# Patient Record
Sex: Male | Born: 1975 | Race: Black or African American | Hispanic: No | Marital: Single | Smoking: Current every day smoker
Health system: Southern US, Community
[De-identification: ages and names within clinical notes are randomized; demographics above are authoritative.]

## PROBLEM LIST (undated history)

## (undated) DIAGNOSIS — F209 Schizophrenia, unspecified: Secondary | ICD-10-CM

## (undated) DIAGNOSIS — N182 Chronic kidney disease, stage 2 (mild): Secondary | ICD-10-CM

## (undated) DIAGNOSIS — D509 Iron deficiency anemia, unspecified: Secondary | ICD-10-CM

## (undated) DIAGNOSIS — Z720 Tobacco use: Secondary | ICD-10-CM

## (undated) HISTORY — PX: NEPHROSTOMY: SHX1014

## (undated) HISTORY — PX: OTHER SURGICAL HISTORY: SHX169

## (undated) HISTORY — PX: HERNIA REPAIR: SHX51

---

## 2001-05-11 ENCOUNTER — Emergency Department (HOSPITAL_COMMUNITY): Admission: EM | Admit: 2001-05-11 | Discharge: 2001-05-11 | Payer: Self-pay | Admitting: Emergency Medicine

## 2018-02-13 DIAGNOSIS — R319 Hematuria, unspecified: Secondary | ICD-10-CM | POA: Diagnosis not present

## 2018-02-13 DIAGNOSIS — N10 Acute tubulo-interstitial nephritis: Secondary | ICD-10-CM

## 2018-02-13 DIAGNOSIS — N1330 Unspecified hydronephrosis: Secondary | ICD-10-CM

## 2018-02-13 DIAGNOSIS — C679 Malignant neoplasm of bladder, unspecified: Secondary | ICD-10-CM

## 2018-02-14 DIAGNOSIS — R319 Hematuria, unspecified: Secondary | ICD-10-CM | POA: Diagnosis not present

## 2018-02-14 DIAGNOSIS — N1330 Unspecified hydronephrosis: Secondary | ICD-10-CM | POA: Diagnosis not present

## 2018-02-14 DIAGNOSIS — N10 Acute tubulo-interstitial nephritis: Secondary | ICD-10-CM | POA: Diagnosis not present

## 2018-02-14 DIAGNOSIS — C679 Malignant neoplasm of bladder, unspecified: Secondary | ICD-10-CM | POA: Diagnosis not present

## 2018-02-15 DIAGNOSIS — C679 Malignant neoplasm of bladder, unspecified: Secondary | ICD-10-CM | POA: Diagnosis not present

## 2018-02-15 DIAGNOSIS — N10 Acute tubulo-interstitial nephritis: Secondary | ICD-10-CM | POA: Diagnosis not present

## 2018-02-15 DIAGNOSIS — N1330 Unspecified hydronephrosis: Secondary | ICD-10-CM | POA: Diagnosis not present

## 2018-02-15 DIAGNOSIS — R319 Hematuria, unspecified: Secondary | ICD-10-CM | POA: Diagnosis not present

## 2018-02-16 DIAGNOSIS — N1330 Unspecified hydronephrosis: Secondary | ICD-10-CM | POA: Diagnosis not present

## 2018-02-16 DIAGNOSIS — C679 Malignant neoplasm of bladder, unspecified: Secondary | ICD-10-CM | POA: Diagnosis not present

## 2018-02-16 DIAGNOSIS — R319 Hematuria, unspecified: Secondary | ICD-10-CM | POA: Diagnosis not present

## 2018-02-16 DIAGNOSIS — N10 Acute tubulo-interstitial nephritis: Secondary | ICD-10-CM | POA: Diagnosis not present

## 2018-02-17 DIAGNOSIS — N10 Acute tubulo-interstitial nephritis: Secondary | ICD-10-CM | POA: Diagnosis not present

## 2018-02-17 DIAGNOSIS — C679 Malignant neoplasm of bladder, unspecified: Secondary | ICD-10-CM | POA: Diagnosis not present

## 2018-02-17 DIAGNOSIS — N1330 Unspecified hydronephrosis: Secondary | ICD-10-CM | POA: Diagnosis not present

## 2018-02-17 DIAGNOSIS — R319 Hematuria, unspecified: Secondary | ICD-10-CM | POA: Diagnosis not present

## 2018-02-18 DIAGNOSIS — N1330 Unspecified hydronephrosis: Secondary | ICD-10-CM | POA: Diagnosis not present

## 2018-02-18 DIAGNOSIS — C679 Malignant neoplasm of bladder, unspecified: Secondary | ICD-10-CM | POA: Diagnosis not present

## 2018-02-18 DIAGNOSIS — R319 Hematuria, unspecified: Secondary | ICD-10-CM | POA: Diagnosis not present

## 2018-02-18 DIAGNOSIS — N10 Acute tubulo-interstitial nephritis: Secondary | ICD-10-CM | POA: Diagnosis not present

## 2018-05-03 DIAGNOSIS — R590 Localized enlarged lymph nodes: Secondary | ICD-10-CM | POA: Diagnosis not present

## 2018-05-03 DIAGNOSIS — N1330 Unspecified hydronephrosis: Secondary | ICD-10-CM | POA: Diagnosis not present

## 2018-05-03 DIAGNOSIS — C679 Malignant neoplasm of bladder, unspecified: Secondary | ICD-10-CM | POA: Diagnosis not present

## 2018-05-03 DIAGNOSIS — Z809 Family history of malignant neoplasm, unspecified: Secondary | ICD-10-CM | POA: Diagnosis not present

## 2018-05-06 DIAGNOSIS — C772 Secondary and unspecified malignant neoplasm of intra-abdominal lymph nodes: Secondary | ICD-10-CM

## 2018-05-06 DIAGNOSIS — D508 Other iron deficiency anemias: Secondary | ICD-10-CM | POA: Diagnosis not present

## 2018-05-06 DIAGNOSIS — R319 Hematuria, unspecified: Secondary | ICD-10-CM | POA: Diagnosis not present

## 2018-05-06 DIAGNOSIS — C679 Malignant neoplasm of bladder, unspecified: Secondary | ICD-10-CM

## 2018-05-24 DIAGNOSIS — C679 Malignant neoplasm of bladder, unspecified: Secondary | ICD-10-CM | POA: Diagnosis not present

## 2018-05-24 DIAGNOSIS — C772 Secondary and unspecified malignant neoplasm of intra-abdominal lymph nodes: Secondary | ICD-10-CM

## 2018-05-31 DIAGNOSIS — C672 Malignant neoplasm of lateral wall of bladder: Secondary | ICD-10-CM | POA: Diagnosis not present

## 2018-05-31 DIAGNOSIS — I517 Cardiomegaly: Secondary | ICD-10-CM | POA: Diagnosis not present

## 2018-06-16 DIAGNOSIS — C772 Secondary and unspecified malignant neoplasm of intra-abdominal lymph nodes: Secondary | ICD-10-CM | POA: Diagnosis not present

## 2018-06-16 DIAGNOSIS — C679 Malignant neoplasm of bladder, unspecified: Secondary | ICD-10-CM

## 2018-07-07 DIAGNOSIS — F209 Schizophrenia, unspecified: Secondary | ICD-10-CM

## 2018-07-07 DIAGNOSIS — C679 Malignant neoplasm of bladder, unspecified: Secondary | ICD-10-CM | POA: Diagnosis not present

## 2018-07-07 DIAGNOSIS — C772 Secondary and unspecified malignant neoplasm of intra-abdominal lymph nodes: Secondary | ICD-10-CM

## 2018-08-18 DIAGNOSIS — F209 Schizophrenia, unspecified: Secondary | ICD-10-CM

## 2018-08-18 DIAGNOSIS — C772 Secondary and unspecified malignant neoplasm of intra-abdominal lymph nodes: Secondary | ICD-10-CM

## 2018-08-18 DIAGNOSIS — C679 Malignant neoplasm of bladder, unspecified: Secondary | ICD-10-CM

## 2018-10-26 DIAGNOSIS — N390 Urinary tract infection, site not specified: Secondary | ICD-10-CM | POA: Diagnosis not present

## 2018-10-26 DIAGNOSIS — D500 Iron deficiency anemia secondary to blood loss (chronic): Secondary | ICD-10-CM | POA: Diagnosis not present

## 2018-10-26 DIAGNOSIS — R109 Unspecified abdominal pain: Secondary | ICD-10-CM

## 2018-10-26 DIAGNOSIS — C678 Malignant neoplasm of overlapping sites of bladder: Secondary | ICD-10-CM

## 2018-10-26 DIAGNOSIS — F209 Schizophrenia, unspecified: Secondary | ICD-10-CM | POA: Diagnosis not present

## 2018-10-26 DIAGNOSIS — R319 Hematuria, unspecified: Secondary | ICD-10-CM

## 2018-10-27 DIAGNOSIS — F209 Schizophrenia, unspecified: Secondary | ICD-10-CM | POA: Diagnosis not present

## 2018-10-27 DIAGNOSIS — D500 Iron deficiency anemia secondary to blood loss (chronic): Secondary | ICD-10-CM | POA: Diagnosis not present

## 2018-10-27 DIAGNOSIS — N390 Urinary tract infection, site not specified: Secondary | ICD-10-CM | POA: Diagnosis not present

## 2018-10-27 DIAGNOSIS — R109 Unspecified abdominal pain: Secondary | ICD-10-CM | POA: Diagnosis not present

## 2018-10-28 DIAGNOSIS — R109 Unspecified abdominal pain: Secondary | ICD-10-CM | POA: Diagnosis not present

## 2018-10-28 DIAGNOSIS — D500 Iron deficiency anemia secondary to blood loss (chronic): Secondary | ICD-10-CM | POA: Diagnosis not present

## 2018-10-28 DIAGNOSIS — N390 Urinary tract infection, site not specified: Secondary | ICD-10-CM | POA: Diagnosis not present

## 2018-10-28 DIAGNOSIS — F209 Schizophrenia, unspecified: Secondary | ICD-10-CM | POA: Diagnosis not present

## 2018-10-29 DIAGNOSIS — R109 Unspecified abdominal pain: Secondary | ICD-10-CM | POA: Diagnosis not present

## 2018-10-29 DIAGNOSIS — F209 Schizophrenia, unspecified: Secondary | ICD-10-CM | POA: Diagnosis not present

## 2018-10-29 DIAGNOSIS — N390 Urinary tract infection, site not specified: Secondary | ICD-10-CM | POA: Diagnosis not present

## 2018-10-29 DIAGNOSIS — D500 Iron deficiency anemia secondary to blood loss (chronic): Secondary | ICD-10-CM | POA: Diagnosis not present

## 2018-10-30 DIAGNOSIS — R109 Unspecified abdominal pain: Secondary | ICD-10-CM | POA: Diagnosis not present

## 2018-10-30 DIAGNOSIS — D649 Anemia, unspecified: Secondary | ICD-10-CM

## 2018-10-30 DIAGNOSIS — F209 Schizophrenia, unspecified: Secondary | ICD-10-CM | POA: Diagnosis not present

## 2018-10-30 DIAGNOSIS — N390 Urinary tract infection, site not specified: Secondary | ICD-10-CM | POA: Diagnosis not present

## 2018-10-30 DIAGNOSIS — D500 Iron deficiency anemia secondary to blood loss (chronic): Secondary | ICD-10-CM | POA: Diagnosis not present

## 2018-10-31 DIAGNOSIS — N390 Urinary tract infection, site not specified: Secondary | ICD-10-CM | POA: Diagnosis not present

## 2018-10-31 DIAGNOSIS — D500 Iron deficiency anemia secondary to blood loss (chronic): Secondary | ICD-10-CM | POA: Diagnosis not present

## 2018-10-31 DIAGNOSIS — F209 Schizophrenia, unspecified: Secondary | ICD-10-CM | POA: Diagnosis not present

## 2018-10-31 DIAGNOSIS — R109 Unspecified abdominal pain: Secondary | ICD-10-CM | POA: Diagnosis not present

## 2018-11-01 DIAGNOSIS — D649 Anemia, unspecified: Secondary | ICD-10-CM

## 2018-11-01 DIAGNOSIS — K921 Melena: Secondary | ICD-10-CM | POA: Diagnosis not present

## 2018-11-01 DIAGNOSIS — C772 Secondary and unspecified malignant neoplasm of intra-abdominal lymph nodes: Secondary | ICD-10-CM | POA: Diagnosis not present

## 2018-11-01 DIAGNOSIS — C678 Malignant neoplasm of overlapping sites of bladder: Secondary | ICD-10-CM | POA: Diagnosis not present

## 2018-11-01 DIAGNOSIS — F4310 Post-traumatic stress disorder, unspecified: Secondary | ICD-10-CM

## 2018-11-01 DIAGNOSIS — F209 Schizophrenia, unspecified: Secondary | ICD-10-CM

## 2018-11-01 DIAGNOSIS — C679 Malignant neoplasm of bladder, unspecified: Secondary | ICD-10-CM | POA: Diagnosis not present

## 2018-11-02 DIAGNOSIS — C678 Malignant neoplasm of overlapping sites of bladder: Secondary | ICD-10-CM | POA: Diagnosis not present

## 2018-11-02 DIAGNOSIS — F209 Schizophrenia, unspecified: Secondary | ICD-10-CM | POA: Diagnosis not present

## 2018-11-02 DIAGNOSIS — K921 Melena: Secondary | ICD-10-CM | POA: Diagnosis not present

## 2018-11-02 DIAGNOSIS — D649 Anemia, unspecified: Secondary | ICD-10-CM | POA: Diagnosis not present

## 2018-11-03 DIAGNOSIS — F209 Schizophrenia, unspecified: Secondary | ICD-10-CM | POA: Diagnosis not present

## 2018-11-03 DIAGNOSIS — D649 Anemia, unspecified: Secondary | ICD-10-CM | POA: Diagnosis not present

## 2018-11-03 DIAGNOSIS — C678 Malignant neoplasm of overlapping sites of bladder: Secondary | ICD-10-CM | POA: Diagnosis not present

## 2018-11-03 DIAGNOSIS — K921 Melena: Secondary | ICD-10-CM | POA: Diagnosis not present

## 2018-11-04 ENCOUNTER — Inpatient Hospital Stay (HOSPITAL_COMMUNITY): Payer: Medicare Other

## 2018-11-04 ENCOUNTER — Encounter (HOSPITAL_COMMUNITY)
Admission: AD | Disposition: A | Discharge status: Home / Self Care | Payer: Self-pay | Source: Other Acute Inpatient Hospital | Attending: Internal Medicine

## 2018-11-04 ENCOUNTER — Encounter (HOSPITAL_COMMUNITY): Payer: Self-pay

## 2018-11-04 ENCOUNTER — Inpatient Hospital Stay (HOSPITAL_COMMUNITY): Payer: Medicare Other | Admitting: Anesthesiology

## 2018-11-04 ENCOUNTER — Inpatient Hospital Stay (HOSPITAL_COMMUNITY)
Admission: AD | Admit: 2018-11-04 | Discharge: 2018-11-07 | DRG: 871 | Disposition: A | Discharge status: Home / Self Care | Payer: Medicare Other | Source: Other Acute Inpatient Hospital | Attending: Internal Medicine | Admitting: Internal Medicine

## 2018-11-04 ENCOUNTER — Other Ambulatory Visit: Payer: Self-pay

## 2018-11-04 DIAGNOSIS — Z8551 Personal history of malignant neoplasm of bladder: Secondary | ICD-10-CM

## 2018-11-04 DIAGNOSIS — F17200 Nicotine dependence, unspecified, uncomplicated: Secondary | ICD-10-CM | POA: Diagnosis present

## 2018-11-04 DIAGNOSIS — E43 Unspecified severe protein-calorie malnutrition: Secondary | ICD-10-CM | POA: Diagnosis present

## 2018-11-04 DIAGNOSIS — K644 Residual hemorrhoidal skin tags: Secondary | ICD-10-CM | POA: Diagnosis present

## 2018-11-04 DIAGNOSIS — C679 Malignant neoplasm of bladder, unspecified: Secondary | ICD-10-CM | POA: Diagnosis present

## 2018-11-04 DIAGNOSIS — D509 Iron deficiency anemia, unspecified: Secondary | ICD-10-CM | POA: Diagnosis not present

## 2018-11-04 DIAGNOSIS — N182 Chronic kidney disease, stage 2 (mild): Secondary | ICD-10-CM | POA: Diagnosis present

## 2018-11-04 DIAGNOSIS — D500 Iron deficiency anemia secondary to blood loss (chronic): Secondary | ICD-10-CM | POA: Diagnosis present

## 2018-11-04 DIAGNOSIS — C7919 Secondary malignant neoplasm of other urinary organs: Secondary | ICD-10-CM | POA: Diagnosis present

## 2018-11-04 DIAGNOSIS — Z720 Tobacco use: Secondary | ICD-10-CM | POA: Diagnosis not present

## 2018-11-04 DIAGNOSIS — D638 Anemia in other chronic diseases classified elsewhere: Secondary | ICD-10-CM | POA: Diagnosis present

## 2018-11-04 DIAGNOSIS — Z884 Allergy status to anesthetic agent status: Secondary | ICD-10-CM

## 2018-11-04 DIAGNOSIS — N136 Pyonephrosis: Secondary | ICD-10-CM | POA: Diagnosis present

## 2018-11-04 DIAGNOSIS — R52 Pain, unspecified: Secondary | ICD-10-CM

## 2018-11-04 DIAGNOSIS — Z7189 Other specified counseling: Secondary | ICD-10-CM

## 2018-11-04 DIAGNOSIS — K922 Gastrointestinal hemorrhage, unspecified: Secondary | ICD-10-CM

## 2018-11-04 DIAGNOSIS — N390 Urinary tract infection, site not specified: Secondary | ICD-10-CM | POA: Diagnosis present

## 2018-11-04 DIAGNOSIS — Z936 Other artificial openings of urinary tract status: Secondary | ICD-10-CM | POA: Diagnosis not present

## 2018-11-04 DIAGNOSIS — K921 Melena: Secondary | ICD-10-CM | POA: Diagnosis present

## 2018-11-04 DIAGNOSIS — R509 Fever, unspecified: Secondary | ICD-10-CM | POA: Diagnosis not present

## 2018-11-04 DIAGNOSIS — A419 Sepsis, unspecified organism: Secondary | ICD-10-CM | POA: Diagnosis present

## 2018-11-04 DIAGNOSIS — K648 Other hemorrhoids: Secondary | ICD-10-CM | POA: Diagnosis present

## 2018-11-04 DIAGNOSIS — M545 Low back pain: Secondary | ICD-10-CM | POA: Diagnosis not present

## 2018-11-04 DIAGNOSIS — Z515 Encounter for palliative care: Secondary | ICD-10-CM | POA: Diagnosis present

## 2018-11-04 DIAGNOSIS — Z681 Body mass index (BMI) 19 or less, adult: Secondary | ICD-10-CM

## 2018-11-04 DIAGNOSIS — K56699 Other intestinal obstruction unspecified as to partial versus complete obstruction: Secondary | ICD-10-CM | POA: Diagnosis present

## 2018-11-04 DIAGNOSIS — F209 Schizophrenia, unspecified: Secondary | ICD-10-CM | POA: Diagnosis present

## 2018-11-04 DIAGNOSIS — D72829 Elevated white blood cell count, unspecified: Secondary | ICD-10-CM

## 2018-11-04 HISTORY — DX: Schizophrenia, unspecified: F20.9

## 2018-11-04 HISTORY — DX: Tobacco use: Z72.0

## 2018-11-04 HISTORY — PX: COLONOSCOPY: SHX5424

## 2018-11-04 HISTORY — DX: Chronic kidney disease, stage 2 (mild): N18.2

## 2018-11-04 HISTORY — DX: Iron deficiency anemia, unspecified: D50.9

## 2018-11-04 LAB — CBC
HCT: 28 % — ABNORMAL LOW (ref 39.0–52.0)
HCT: 28 % — ABNORMAL LOW (ref 39.0–52.0)
HCT: 28.7 % — ABNORMAL LOW (ref 39.0–52.0)
HCT: 25.3 % — ABNORMAL LOW (ref 39.0–52.0)
Hemoglobin: 8.4 g/dL — ABNORMAL LOW (ref 13.0–17.0)
Hemoglobin: 8.4 g/dL — ABNORMAL LOW (ref 13.0–17.0)
Hemoglobin: 8.4 g/dL — ABNORMAL LOW (ref 13.0–17.0)
Hemoglobin: 7.6 g/dL — ABNORMAL LOW (ref 13.0–17.0)
MCH: 25.8 pg — ABNORMAL LOW (ref 26.0–34.0)
MCH: 25.7 pg — ABNORMAL LOW (ref 26.0–34.0)
MCH: 25.3 pg — ABNORMAL LOW (ref 26.0–34.0)
MCH: 25.6 pg — ABNORMAL LOW (ref 26.0–34.0)
MCHC: 30 g/dL (ref 30.0–36.0)
MCHC: 30 g/dL (ref 30.0–36.0)
MCHC: 29.3 g/dL — ABNORMAL LOW (ref 30.0–36.0)
MCHC: 30 g/dL (ref 30.0–36.0)
MCV: 85.9 fL (ref 80.0–100.0)
MCV: 85.6 fL (ref 80.0–100.0)
MCV: 86.4 fL (ref 80.0–100.0)
MCV: 85.2 fL (ref 80.0–100.0)
PLATELETS: 393 10*3/uL (ref 150–400)
Platelets: 404 10*3/uL — ABNORMAL HIGH (ref 150–400)
Platelets: 411 10*3/uL — ABNORMAL HIGH (ref 150–400)
Platelets: 420 10*3/uL — ABNORMAL HIGH (ref 150–400)
RBC: 3.26 MIL/uL — ABNORMAL LOW (ref 4.22–5.81)
RBC: 3.27 MIL/uL — ABNORMAL LOW (ref 4.22–5.81)
RBC: 3.32 MIL/uL — ABNORMAL LOW (ref 4.22–5.81)
RBC: 2.97 MIL/uL — ABNORMAL LOW (ref 4.22–5.81)
RDW: 18.9 % — ABNORMAL HIGH (ref 11.5–15.5)
RDW: 18.8 % — ABNORMAL HIGH (ref 11.5–15.5)
RDW: 18.9 % — ABNORMAL HIGH (ref 11.5–15.5)
RDW: 18.7 % — ABNORMAL HIGH (ref 11.5–15.5)
WBC: 31.4 10*3/uL — ABNORMAL HIGH (ref 4.0–10.5)
WBC: 32.5 10*3/uL — ABNORMAL HIGH (ref 4.0–10.5)
WBC: 35.9 10*3/uL — ABNORMAL HIGH (ref 4.0–10.5)
WBC: 34.4 10*3/uL — AB (ref 4.0–10.5)
nRBC: 0 % (ref 0.0–0.2)
nRBC: 0 % (ref 0.0–0.2)
nRBC: 0 % (ref 0.0–0.2)
nRBC: 0 % (ref 0.0–0.2)

## 2018-11-04 LAB — DIFFERENTIAL
Abs Immature Granulocytes: 0.36 10*3/uL — ABNORMAL HIGH (ref 0.00–0.07)
Basophils Absolute: 0.1 10*3/uL (ref 0.0–0.1)
Basophils Relative: 0 %
Eosinophils Absolute: 0.2 10*3/uL (ref 0.0–0.5)
Eosinophils Relative: 1 %
Immature Granulocytes: 1 %
LYMPHS ABS: 2.7 10*3/uL (ref 0.7–4.0)
Lymphocytes Relative: 9 %
MONOS PCT: 6 %
Monocytes Absolute: 1.8 10*3/uL — ABNORMAL HIGH (ref 0.1–1.0)
Neutro Abs: 26.4 10*3/uL — ABNORMAL HIGH (ref 1.7–7.7)
Neutrophils Relative %: 83 %

## 2018-11-04 LAB — BASIC METABOLIC PANEL
Anion gap: 8 (ref 5–15)
BUN: 10 mg/dL (ref 6–20)
CO2: 23 mmol/L (ref 22–32)
Calcium: 11.2 mg/dL — ABNORMAL HIGH (ref 8.9–10.3)
Chloride: 105 mmol/L (ref 98–111)
Creatinine, Ser: 1.13 mg/dL (ref 0.61–1.24)
GFR calc Af Amer: >60 mL/min (ref >60)
GFR calc non Af Amer: >60 mL/min (ref >60)
GLUCOSE: 116 mg/dL — AB (ref 70–99)
Potassium: 3.9 mmol/L (ref 3.5–5.1)
Sodium: 136 mmol/L (ref 135–145)

## 2018-11-04 LAB — URINALYSIS, ROUTINE W REFLEX MICROSCOPIC
Bilirubin Urine: NEGATIVE
Glucose, UA: NEGATIVE mg/dL
Ketones, ur: NEGATIVE mg/dL
Nitrite: NEGATIVE
Protein, ur: 100 mg/dL — AB
RBC / HPF: >50 RBC/hpf — ABNORMAL HIGH (ref 0–5)
Specific Gravity, Urine: 1.026 (ref 1.005–1.030)
WBC, UA: >50 WBC/hpf — ABNORMAL HIGH (ref 0–5)
pH: 7 (ref 5.0–8.0)

## 2018-11-04 LAB — PROTIME-INR
INR: 1.2
INR: 1.24
Prothrombin Time: 15.1 seconds (ref 11.4–15.2)
Prothrombin Time: 15.4 seconds — ABNORMAL HIGH (ref 11.4–15.2)

## 2018-11-04 LAB — APTT
aPTT: 31 seconds (ref 24–36)
aPTT: 42 seconds — ABNORMAL HIGH (ref 24–36)

## 2018-11-04 LAB — LACTIC ACID, PLASMA
Lactic Acid, Venous: 0.7 mmol/L (ref 0.5–1.9)
Lactic Acid, Venous: 1.5 mmol/L (ref 0.5–1.9)

## 2018-11-04 LAB — ABO/RH: ABO/RH(D): B POS

## 2018-11-04 LAB — PROCALCITONIN: Procalcitonin: 4.44 ng/mL

## 2018-11-04 LAB — HIV ANTIBODY (ROUTINE TESTING W REFLEX): HIV Screen 4th Generation wRfx: NONREACTIVE

## 2018-11-04 SURGERY — COLONOSCOPY
Anesthesia: Monitor Anesthesia Care

## 2018-11-04 MED ORDER — SODIUM CHLORIDE 0.9 % IV BOLUS
500.0000 mL | Freq: Once | INTRAVENOUS | Status: AC
  Administered 2018-11-04: 500 mL via INTRAVENOUS

## 2018-11-04 MED ORDER — CLOZAPINE 100 MG PO TABS
100.0000 mg | ORAL_TABLET | Freq: Every morning | ORAL | Status: DC
  Administered 2018-11-04 – 2018-11-05 (×2): 100 mg via ORAL
  Filled 2018-11-04 (×3): qty 1

## 2018-11-04 MED ORDER — UNJURY CHICKEN SOUP POWDER
2.0000 [oz_av] | Freq: Four times a day (QID) | ORAL | Status: DC
  Administered 2018-11-05 – 2018-11-06 (×2): 2 [oz_av] via ORAL
  Filled 2018-11-04 (×15): qty 27

## 2018-11-04 MED ORDER — NICOTINE 21 MG/24HR TD PT24
21.0000 mg | MEDICATED_PATCH | Freq: Every day | TRANSDERMAL | Status: DC
  Administered 2018-11-04 – 2018-11-07 (×3): 21 mg via TRANSDERMAL
  Filled 2018-11-04 (×4): qty 1

## 2018-11-04 MED ORDER — PROSIGHT PO TABS
1.0000 | ORAL_TABLET | Freq: Every day | ORAL | Status: DC
  Administered 2018-11-04 – 2018-11-07 (×4): 1 via ORAL
  Filled 2018-11-04 (×4): qty 1

## 2018-11-04 MED ORDER — SODIUM CHLORIDE 0.9 % IV SOLN: INTRAVENOUS | Status: DC

## 2018-11-04 MED ORDER — ONDANSETRON HCL 4 MG PO TABS: 4.0000 mg | ORAL_TABLET | Freq: Four times a day (QID) | ORAL | Status: DC | PRN

## 2018-11-04 MED ORDER — PIPERACILLIN-TAZOBACTAM 3.375 G IVPB
3.3750 g | Freq: Three times a day (TID) | INTRAVENOUS | Status: DC
  Administered 2018-11-04 – 2018-11-07 (×10): 3.375 g via INTRAVENOUS
  Filled 2018-11-04 (×12): qty 50

## 2018-11-04 MED ORDER — SENNOSIDES-DOCUSATE SODIUM 8.6-50 MG PO TABS: 1.0000 | ORAL_TABLET | Freq: Every day | ORAL | Status: DC | PRN

## 2018-11-04 MED ORDER — ZOLPIDEM TARTRATE 5 MG PO TABS: 5.0000 mg | ORAL_TABLET | Freq: Every evening | ORAL | Status: DC | PRN

## 2018-11-04 MED ORDER — HYDRALAZINE HCL 20 MG/ML IJ SOLN: 5.0000 mg | INTRAMUSCULAR | Status: DC | PRN

## 2018-11-04 MED ORDER — PROPOFOL 10 MG/ML IV BOLUS
INTRAVENOUS | Status: AC
  Filled 2018-11-04: qty 40

## 2018-11-04 MED ORDER — ENSURE ENLIVE PO LIQD
237.0000 mL | Freq: Three times a day (TID) | ORAL | Status: DC
  Administered 2018-11-04 – 2018-11-06 (×5): 237 mL via ORAL

## 2018-11-04 MED ORDER — ACETAMINOPHEN 325 MG PO TABS
650.0000 mg | ORAL_TABLET | Freq: Four times a day (QID) | ORAL | Status: DC | PRN
  Administered 2018-11-04 – 2018-11-06 (×2): 650 mg via ORAL
  Filled 2018-11-04 (×2): qty 2

## 2018-11-04 MED ORDER — PANTOPRAZOLE SODIUM 40 MG PO TBEC
40.0000 mg | DELAYED_RELEASE_TABLET | Freq: Two times a day (BID) | ORAL | Status: DC
  Administered 2018-11-04 – 2018-11-07 (×8): 40 mg via ORAL
  Filled 2018-11-04 (×8): qty 1

## 2018-11-04 MED ORDER — PROPOFOL 500 MG/50ML IV EMUL
INTRAVENOUS | Status: DC | PRN
  Administered 2018-11-04: 150 ug/kg/min via INTRAVENOUS

## 2018-11-04 MED ORDER — PROPOFOL 10 MG/ML IV BOLUS
INTRAVENOUS | Status: DC | PRN
  Administered 2018-11-04: 30 mg via INTRAVENOUS

## 2018-11-04 MED ORDER — CLOZAPINE 100 MG PO TABS
200.0000 mg | ORAL_TABLET | Freq: Every day | ORAL | Status: DC
  Administered 2018-11-04 – 2018-11-05 (×2): 200 mg via ORAL
  Filled 2018-11-04 (×3): qty 2

## 2018-11-04 MED ORDER — MORPHINE SULFATE (PF) 2 MG/ML IV SOLN
2.0000 mg | INTRAVENOUS | Status: DC | PRN
  Administered 2018-11-04 – 2018-11-07 (×4): 2 mg via INTRAVENOUS
  Filled 2018-11-04 (×4): qty 1

## 2018-11-04 MED ORDER — SODIUM CHLORIDE 0.9 % IV BOLUS
1000.0000 mL | Freq: Once | INTRAVENOUS | Status: AC
  Administered 2018-11-04: 1000 mL via INTRAVENOUS

## 2018-11-04 MED ORDER — OCUVITE-LUTEIN PO CAPS: 1.0000 | ORAL_CAPSULE | Freq: Every day | ORAL | Status: DC

## 2018-11-04 MED ORDER — FERROUS SULFATE 325 (65 FE) MG PO TABS
325.0000 mg | ORAL_TABLET | Freq: Two times a day (BID) | ORAL | Status: DC
  Administered 2018-11-04 – 2018-11-07 (×6): 325 mg via ORAL
  Filled 2018-11-04 (×6): qty 1

## 2018-11-04 MED ORDER — ACETAMINOPHEN 650 MG RE SUPP: 650.0000 mg | Freq: Four times a day (QID) | RECTAL | Status: DC | PRN

## 2018-11-04 MED ORDER — ONDANSETRON HCL 4 MG/2ML IJ SOLN: 4.0000 mg | Freq: Four times a day (QID) | INTRAMUSCULAR | Status: DC | PRN

## 2018-11-04 MED ORDER — OXYCODONE HCL 5 MG PO TABS
5.0000 mg | ORAL_TABLET | Freq: Four times a day (QID) | ORAL | Status: DC | PRN
  Administered 2018-11-05 – 2018-11-07 (×5): 5 mg via ORAL
  Filled 2018-11-04 (×5): qty 1

## 2018-11-04 MED ORDER — SODIUM CHLORIDE 0.9 % IV SOLN
INTRAVENOUS | Status: DC
  Administered 2018-11-05 – 2018-11-06 (×5): via INTRAVENOUS

## 2018-11-04 MED ORDER — VANCOMYCIN HCL 10 G IV SOLR
1250.0000 mg | INTRAVENOUS | Status: DC
  Administered 2018-11-04 – 2018-11-05 (×2): 1250 mg via INTRAVENOUS
  Filled 2018-11-04 (×2): qty 1250

## 2018-11-04 NOTE — Transfer of Care (Signed)
Immediate Anesthesia Transfer of Care Note  Patient: Nathan Knox  Procedure(s) Performed: COLONOSCOPY (N/A )  Patient Location: PACU and Endoscopy Unit  Anesthesia Type:MAC  Level of Consciousness: awake, drowsy and responds to stimulation  Airway & Oxygen Therapy: Patient Spontanous Breathing and Patient connected to face mask oxygen  Post-op Assessment: Report given to RN and Post -op Vital signs reviewed and stable  Post vital signs: Reviewed and stable  Last Vitals:  Vitals Value Taken Time  BP    Temp    Pulse    Resp    SpO2      Last Pain:  Vitals:   11/04/18 1142  TempSrc: Oral  PainSc: 5          Complications: No apparent anesthesia complications

## 2018-11-04 NOTE — Anesthesia Postprocedure Evaluation (Signed)
Anesthesia Post Note  Patient: Nathan Knox  Procedure(s) Performed: COLONOSCOPY (N/A )     Patient location during evaluation: Endoscopy Anesthesia Type: MAC Level of consciousness: awake and alert Pain management: pain level controlled Vital Signs Assessment: post-procedure vital signs reviewed and stable Respiratory status: spontaneous breathing, nonlabored ventilation, respiratory function stable and patient connected to nasal cannula oxygen Cardiovascular status: blood pressure returned to baseline and stable Postop Assessment: no apparent nausea or vomiting Anesthetic complications: no    Last Vitals:  Vitals:   11/04/18 1220 11/04/18 1230  BP: (!) 90/42 (!) 101/48  Pulse: 95 97  Resp: (!) 27 17  Temp:    SpO2: 100% 100%    Last Pain:  Vitals:   11/04/18 1142  TempSrc: Oral  PainSc: 5                  Sophiana Milanese DANIEL

## 2018-11-04 NOTE — Progress Notes (Signed)
Pharmacy Antibiotic Note  Osamu Olguin is a 42 y.o. male admitted on 11/04/2018 with UTI and Sepsis  Pharmacy has been consulted for zosyn dosing.  Plan: Zosyn 3.375g IV q8h (4 hour infusion).  F/u scr/cultures     Temp (24hrs), Avg:98.7 F (37.1 C), Min:98.7 F (37.1 C), Max:98.7 F (37.1 C)  No results for input(s): WBC, CREATININE, LATICACIDVEN, VANCOTROUGH, VANCOPEAK, VANCORANDOM, GENTTROUGH, GENTPEAK, GENTRANDOM, TOBRATROUGH, TOBRAPEAK, TOBRARND, AMIKACINPEAK, AMIKACINTROU, AMIKACIN in the last 168 hours.  CrCl cannot be calculated (No successful lab value found.).    Allergies  Allergen Reactions  . Anesthesia S-I-60     Hyperthermia, coma-like statw    Antimicrobials this admission: 12/13 zosyn >>    >>   Dose adjustments this admission:   Microbiology results:  BCx:   UCx:    Sputum:    MRSA PCR:  Thank you for allowing pharmacy to be a part of this patient's care.  Dorrene German 11/04/2018 2:45 AM

## 2018-11-04 NOTE — Consult Note (Signed)
Reason for Consult: Bright red blood per rectum abnormal CT scan Referring Physician: Hospitalist team  Nathan Knox is an 42 y.o. male.  HPI: Patient seen and examined in hospital computer chart reviewed and case discussed with hospitalist at outlying hospital as well as Dr. Grandville Silos today and on CT it sounds like his tumor may be eroding into his sigmoid and has had bright red blood mixed in his stools for a while and has required multiple transfusions and has not had a flex sig or colonoscopy before and he has no other complaints  Past Medical History:  Diagnosis Date  . CKD (chronic kidney disease), stage II   . Microcytic anemia   . Schizophrenia ([place])   . Tobacco abuse     Past Surgical History:  Procedure Laterality Date  . Bladder cancer removal    . HERNIA REPAIR    . NEPHROSTOMY Bilateral     Family History  Problem Relation Age of Onset  . COPD Mother   . Hypertension Father     Social History:  reports that he has been smoking. He has never used smokeless tobacco. He reports that he does not drink alcohol or use drugs.  Allergies:  Allergies  Allergen Reactions  . Anesthesia S-I-60     Hyperthermia, coma-like statw    Medications: I have reviewed the patient's current medications.  Results for orders placed or performed during the hospital encounter of 11/04/18 (from the past 48 hour(s))  Type and screen [place]     Status: None   Collection Time: 11/04/18  2:49 AM  Result Value Ref Range   ABO/RH(D) B POS    Antibody Screen NEG    Sample Expiration      11/07/2018 Performed at [facility], [place] [address], [place], [place] [address]   ABO/Rh     Status: None   Collection Time: 11/04/18  3:03 AM  Result Value Ref Range   ABO/RH(D)      B POS Performed at [facility], [place] [address] [place], [place] [address]   CBC     Status: Abnormal   Collection Time: 11/04/18  3:06 AM  Result Value  Ref Range   WBC 31.4 (H) 4.0 - 10.5 K/uL    Comment: WHITE COUNT CONFIRMED ON SMEAR   RBC 3.26 (L) 4.22 - 5.81 MIL/uL   Hemoglobin 8.4 (L) 13.0 - 17.0 g/dL   HCT 28.0 (L) 39.0 - 52.0 %   MCV 85.9 80.0 - 100.0 fL   MCH 25.8 (L) 26.0 - 34.0 pg   MCHC 30.0 30.0 - 36.0 g/dL   RDW 18.9 (H) 11.5 - 15.5 %   Platelets 404 (H) 150 - 400 K/uL   nRBC 0.0 0.0 - 0.2 %    Comment: Performed at [facility], [place] [address] [place], [place] [address]  Protime-INR     Status: None   Collection Time: 11/04/18  3:06 AM  Result Value Ref Range   Prothrombin Time 15.1 11.4 - 15.2 seconds   INR 1.20     Comment: Performed at [facility], [place] [address] [place], [place] [address]  APTT     Status: None   Collection Time: 11/04/18  3:06 AM  Result Value Ref Range   aPTT 31 24 - 36 seconds    Comment: Performed at [facility], [place] [address], [place],  [address]  Differential     Status:  Abnormal   Collection Time: 11/04/18  3:06 AM  Result Value Ref Range   Neutrophils Relative % 83 %   Neutro Abs 26.4 (H) 1.7 - 7.7 K/uL   Lymphocytes Relative 9 %   Lymphs Abs 2.7 0.7 - 4.0 K/uL   Monocytes Relative 6 %   Monocytes Absolute 1.8 (H) 0.1 - 1.0 K/uL   Eosinophils Relative 1 %   Eosinophils Absolute 0.2 0.0 - 0.5 K/uL   Basophils Relative 0 %   Basophils Absolute 0.1 0.0 - 0.1 K/uL   Immature Granulocytes 1 %   Abs Immature Granulocytes 0.36 (H) 0.00 - 0.07 K/uL    Comment: Performed at [facility], [place] [address], [place], [place] [address]  Basic metabolic panel     Status: Abnormal   Collection Time: 11/04/18  3:07 AM  Result Value Ref Range   Sodium 136 135 - 145 mmol/L   Potassium 3.9 3.5 - 5.1 mmol/L   Chloride 105 98 - 111 mmol/L   CO2 23 22 - 32 mmol/L   Glucose, Bld 116 (H) 70 - 99 mg/dL   BUN 10 6 - 20 mg/dL   Creatinine, Ser 1.13 0.61 - 1.24 mg/dL   Calcium 11.2 (H) 8.9 - 10.3 mg/dL   GFR calc  non Af Amer >60 >60 mL/min   GFR calc Af Amer >60 >60 mL/min   Anion gap 8 5 - 15    Comment: Performed at [facility], [place] [address], [place], [place] [address]  CBC     Status: Abnormal   Collection Time: 11/04/18  9:46 AM  Result Value Ref Range   WBC 32.5 (H) 4.0 - 10.5 K/uL   RBC 3.27 (L) 4.22 - 5.81 MIL/uL   Hemoglobin 8.4 (L) 13.0 - 17.0 g/dL   HCT 28.0 (L) 39.0 - 52.0 %   MCV 85.6 80.0 - 100.0 fL   MCH 25.7 (L) 26.0 - 34.0 pg   MCHC 30.0 30.0 - 36.0 g/dL   RDW 18.8 (H) 11.5 - 15.5 %   Platelets 411 (H) 150 - 400 K/uL   nRBC 0.0 0.0 - 0.2 %    Comment: Performed at [facility], [place] [address], [place], [place] [address]    No results found.  ROS negative except above Blood pressure (!) 101/51, pulse 96, temperature 98.1 F (36.7 C), temperature source Oral, resp. rate 18, height 6\' 2"  (1.88 m), weight 61.8 kg, SpO2 95 %. Physical Exam vital signs stable afebrile no acute distress exam please see preassessment evaluation labs reviewed  Assessment/Plan: Bright red blood per rectum questionable sigmoid involvement from bladder tumor Plan: We discussed a flex sig which we will proceed with with anesthesia assistance and he may need a full colonoscopy depending on the prep and the findings  Treyven Lafauci E 11/04/2018, 11:35 AM

## 2018-11-04 NOTE — Progress Notes (Signed)
I have seen and assessed patient and agree with Dr.Niu's assessment and plan.  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  Patient of note with history of bladder cancer, status post bladder cancer removal, status post bilateral nephrostomy tubes placed 6 weeks ago at [facility] who elected to stop treatment several months ago.  Patient noted on 09/23/2018 to have a hemoglobin of 6 from 7.7 after presentation rectal bleeding with bright red blood.  Patient transfused packed red blood cells hemoglobin back up to 8.2.  GI was consulted at outside hospital recommending colonoscopy however on day of procedure patient refused.  Patient later changed his mind however gastro enterology was no longer available at the outside hospital for the next 3 weeks and patient subsequently transferred to [facility] for further GI evaluation.  Patient noted to be transfused 2 units packed red blood cells on 10/26/2018 with hemoglobin back up to 8.2.  Patient also noted to have sepsis secondary to UTI with a leukocytosis of 30,000.  Fever noted to have resolved.  CT scan which was done did not show any evidence of abscess.  Patient placed empirically on IV Zosyn.  Repeat cultures have been ordered.  Patient currently n.p.o. last meal was 5:30 PM on 11/03/2018.  Patient denies any rectal bleeding this morning.  Patient likely to undergo flex sigmoidoscopy later on today.  GI consulted, spoke with Dr. Watt Climes.  Bowel prep to be ordered.  Check H&H this afternoon.  Transfusion threshold hemoglobin less than 7.  Follow.   No charge.

## 2018-11-04 NOTE — Progress Notes (Signed)
Patient had a fever of 101.6, and tachycardia 117.  Dr. Grandville Silos on floor notified.  Due to patient being in the red for the mews sepsis protocol, ICU rapid response nurse notified, and will look at patient.  Patient started on tele monitor, currently sinus tach.

## 2018-11-04 NOTE — H&P (Addendum)
History and Physical    Nathan Knox VHQ:[id number] DOB: [birth year] DOA: 11/04/2018  Referring MD/NP/PA:   PCP: Patient, No Pcp Per   Patient coming from:  The patient is coming from home.  At baseline, pt is independent for most of ADL.        Chief Complaint: rectal bleeding and back pain  HPI: Nathan Knox is a 42 y.o. male with medical history significant of advanced bladder urothelial cancer (elevated to stop treatment few months ago), s/p of bilateral nephrostomy tube placement, schizophrenia, CKD-2, who is transfused from [facility] to [facility] due to GIB.  Pt has hx of bladder cancer, status post bladder cancer removal, and s/p of bilateral nephrostomy tubes placed 6 weeks ago at [facility]. He elected to stop treatment for several months ago. Pt was initially admitted to [facility] due to GI bleeding and back pain on 10/26/2018.  Pt states that his right nephrostomy tube stop having any output several weeks ago. He developed severe, constant and sharp back pain. IR was consulted and exchanged his L nephrostomy catheter and placed a new R nephrostomy catheter. They did a left nephrostogram on 12/6 due to concern for lack of output, but it was noted to be in good position. Repeat CT abdomen pelvis shows new right-sided percutaneous nephrostomy, stable large urinary bladder carcinoma, stable left renal mass and lymphadenopathy.   He was found to have sepsis due to UTI. He's been initially treated with ceftriaxone for presumed UTI. Urine cx returned negative. Urology was c/s, but nothing to offer from surgical standpoint. He had recurrent fever on 12/8. He was recultured and abx broadened to zosyn. His fever has resolved. Today his body temperature is 98.7.  He had leukocytosis with WBC 24, which has been persistent. This morning WBC was 30, which was thought to be partially due to cancer.  Pt also has rectal bleeding with bright red blood.  His hemoglobin dropped from 7.7  on 09/23/18 to 6.0. MCV of 73. Normal lactic acid level. Patient was transfused with blood, hemoglobin back to 8.2 this morning. Gastroenterology was consulted. Gastroenterology was planning a colonoscopy, however on the day of procedure patient refused. He later changed his mind, however gastroenterology no longer has coverage in the hospital for the next 3 weeks. Patient is transferred to [facility].  Dr. Watt Climes of GI was consulted.   Radiology Studies: CT scan 12/5 1. There is a masslike rounded region of decreased attenuation in the left kidney measuring 5.1 cm. There is adjacent fat stranding. Whether this masslike region represents tumor or a phlegmon/infection cannot be determined with confidence. Focal pyelonephritis with phlegmon formation is certainly possible. 2. The patient has bilateral nephrostomy tubes. Both tubes are near the periphery of the kidneys. I suspect the right tube has been partially pulled back and there is mild right-sided hydronephrosis suggesting the right nephrostomy tube may not be working correctly. The degree of hydronephrosis on the left however is much less in the interval suggesting the left nephrostomy tube is working.  3. The patient's known large bladder tumor is significantly larger in the interval. There appears to be tumor extending nearly the entire length of the left ureter. No excretion from the left kidney on delayed imaging. 4. Worsening adenopathy in the retroperitoneum. 5. A small rounded region of low attenuation in the liver is nonspecific. A small metastasis is not excluded. Recommend attention on follow-up.  Repeat CT abdomen pelvis shows:  New right percutaneous nephrostomy tube in appropriate position,  with resolution of right hydronephrosis since previous study. Stable large mass within the urinary bladder, consistent with known bladder carcinoma.   Stable left renal mass and enhancing tumor involving the renal collecting system and throughout the  left ureter, consistent with urothelial carcinoma.  Stable left paraaortic and mild bilateral iliac lymphadenopathy, consistent with metastatic disease.  No new or progressive metastatic disease identified.  Review of Systems:   General: no fevers, chills, no body weight gain, has poor appetite, has fatigue HEENT: no blurry vision, hearing changes or sore throat Respiratory: no dyspnea, coughing, wheezing CV: no chest pain, no palpitations GI: has nausea, vomiting, abdominal pain, rectal bleeding. No diarrhea, constipation GU: no dysuria, burning on urination, increased urinary frequency, hematuria  Ext: no leg edema Neuro: no unilateral weakness, numbness, or tingling, no vision change or hearing loss Skin: no rash, no skin tear. MSK: No muscle spasm, no deformity, no limitation of range of movement in spin Heme: No easy bruising.  Travel history: No recent long distant travel.  Allergy:  Allergies  Allergen Reactions  . Anesthesia S-I-60     Hyperthermia, coma-like statw    Past Medical History:  Diagnosis Date  . CKD (chronic kidney disease), stage II   . Microcytic anemia   . Schizophrenia ([place])   . Tobacco abuse     Past Surgical History:  Procedure Laterality Date  . Bladder cancer removal    . HERNIA REPAIR    . NEPHROSTOMY Bilateral     Social History:  reports that he has been smoking. He has never used smokeless tobacco. He reports that he does not drink alcohol or use drugs.  Family History:  Family History  Problem Relation Age of Onset  . COPD Mother   . Hypertension Father      Prior to Admission medications   Not on File    Physical Exam: Vitals:   11/04/18 0140  BP: (!) 93/53  Pulse: 96  Resp: 18  Temp: 98.7 F (37.1 C)  TempSrc: Oral  SpO2: 98%   General: Not in acute distress.  Dry mucus membrane.  Thin body habitus.  Patient is frail. HEENT:       Eyes: PERRL, EOMI, no scleral icterus.       ENT: No discharge from the ears and  nose, no pharynx injection, no tonsillar enlargement.        Neck: No JVD, no bruit, no mass felt. Heme: No neck lymph node enlargement. Cardiac: S1/S2, RRR, No murmurs, No gallops or rubs. Respiratory:  No rales, wheezing, rhonchi or rubs. GI: Soft, nondistended, has diffused mild tenderness, no rebound pain, no organomegaly, BS present. Has bilateral nephrostomy tube placement GU: No hematuria Ext: No pitting leg edema bilaterally. 2+DP/PT pulse bilaterally. Musculoskeletal: No joint deformities, No joint redness or warmth, no limitation of ROM in spin. Skin: No rashes.  Neuro: Alert, oriented X3, cranial nerves II-XII grossly intact, moves all extremities normally.  Psych: Patient is not psychotic, no suicidal or hemocidal ideation.  Labs on Admission: I have personally reviewed following labs and imaging studies  CBC: No results for input(s): WBC, NEUTROABS, HGB, HCT, MCV, PLT in the last 168 hours. Basic Metabolic Panel: No results for input(s): NA, K, CL, CO2, GLUCOSE, BUN, CREATININE, CALCIUM, MG, PHOS in the last 168 hours. GFR: CrCl cannot be calculated (No successful lab value found.). Liver Function Tests: No results for input(s): AST, ALT, ALKPHOS, BILITOT, PROT, ALBUMIN in the last 168 hours. No results for input(s): LIPASE,  AMYLASE in the last 168 hours. No results for input(s): AMMONIA in the last 168 hours. Coagulation Profile: No results for input(s): INR, PROTIME in the last 168 hours. Cardiac Enzymes: No results for input(s): CKTOTAL, CKMB, CKMBINDEX, TROPONINI in the last 168 hours. BNP (last 3 results) No results for input(s): PROBNP in the last 8760 hours. HbA1C: No results for input(s): HGBA1C in the last 72 hours. CBG: No results for input(s): GLUCAP in the last 168 hours. Lipid Profile: No results for input(s): CHOL, HDL, LDLCALC, TRIG, CHOLHDL, LDLDIRECT in the last 72 hours. Thyroid Function Tests: No results for input(s): TSH, T4TOTAL, FREET4, T3FREE,  THYROIDAB in the last 72 hours. Anemia Panel: No results for input(s): VITAMINB12, FOLATE, FERRITIN, TIBC, IRON, RETICCTPCT in the last 72 hours. Urine analysis: No results found for: COLORURINE, APPEARANCEUR, LABSPEC, PHURINE, GLUCOSEU, HGBUR, BILIRUBINUR, KETONESUR, PROTEINUR, UROBILINOGEN, NITRITE, LEUKOCYTESUR Sepsis Labs: @LABRCNTIP (procalcitonin:4,lacticidven:4) )No results found for this or any previous visit (from the past 240 hour(s)).   Radiological Exams on Admission: No results found.   EKG: will get one.   Assessment/Plan Principal Problem:   GI bleeding Active Problems:   Bladder cancer (HCC)   CKD (chronic kidney disease), stage II   Microcytic anemia   Schizophrenia ([facility])   Tobacco abuse   UTI (urinary tract infection)   Sepsis ([facility])   GIB (gastrointestinal bleeding)   Protein-calorie malnutrition, severe ([facility])   GIB and microcytic anemia, chronic disease anemia: hgb was 7.7 on 09/23/18 -->6.0 on 12/4, transfused with 2 units of blood, back to 8.2 this AM.  Currently blood pressure is soft, but hemodynamically stable. Dr. Watt Climes was consulted.   - will admit to med-surg bed as inpt - GI consulted by Ed, will follow up recommendations - NPO - IVF: 1L NS bolus, then at 125 mL/hr - Start oral pantoprazole 40 mg bid - Zofran IV for nausea - Avoid NSAIDs and SQ heparin - Maintain IV access (2 large bore IVs if possible). - Monitor closely and follow q6h cbc, transfuse as necessary, if Hgb<7.0 - Continue ferrous sulfate supplement - LaB: INR, PTT and type screen - RN will inform GI of pt's arrival  Sepsis secondary to urinary tract infection: Fever has resolved.  Still has leukocytosis with WBC 30 this morning, which is likely related to malignancy at least partially. CT scan did not show evidence of abscess -continue zosyn -repeat Bx and Ux  History of schizophrenia -continue his home clozapine  Stage IV bladder carcinoma: s/p of bladder cancer removal.  status post exchange of left nephrostomy catheter and placement of right-sided new nephrostomy on 12/05. Appears to be stable on CT of the abdomen pelvis but advanced and has lymphadenopathy. Previously has refused chemotherapy and immunotherapy. Has somewhat history of noncompliance. Pt continues to not want further treatment. -likely need hospice -palliative consult -Pain control: PRN morphine and oxycodone -PRN Zofran for nausea vomiting  Severe protein calorie malnutrition -encourage oral intake. -Nutrition consult  CKD (chronic kidney disease), stage II: stable.  Baseline creatinine 1.0-1.2.  His recent creatinine is 1.1. Follow-up with by  BMP  Tobacco abuse: -Nicotine patch     Inpatient status:  # Patient requires inpatient status due to high intensity of service, high risk for further deterioration and high frequency of surveillance required.  I certify that at the point of admission it is my clinical judgment that the patient will require inpatient hospital care spanning beyond 2 midnights from the point of admission.  . This patient has multiple chronic  comorbidities including advanced bladder urothelial cancer (elevated to stop treatment few months ago), s/p of bilateral nephrostomy tube placement, schizophrenia, CKD-2 . Now patient has presenting symptoms include GI bleeding and sepsis due to UTI . The worrisome physical exam findings include abdominal tenderness, malnutrition with very thin body habitus, soft blood pressure . The initial radiographic and laboratory data are worrisome because of anemia and  . Current medical needs: please see my assessment and plan . Predictability of an adverse outcome (risk): Patient has advanced bladder urothelial cancer, now presents with GIB and worsening anemia, will need endoscopy procedure.  He also has sepsis secondary to UTI. He is very frail.  He is at high risk of deteriorating.  Patient will need to stay in the hospital for at  least 2 days.        DVT ppx: SCD Code Status: Full code Family Communication:  Yes, patient's mother at bed side Disposition Plan:  Anticipate discharge back to previous home environment Consults called:  GI, Dr. Watt Climes Admission status:  medical floor/inpt     Date of Service 11/04/2018    Ivor Costa [facility] Pager [phone]  If 7PM-7AM, please contact night-coverage [email or web address] Password [facility] 11/04/2018, 3:18 AM

## 2018-11-04 NOTE — Op Note (Signed)
[facility] Patient Name: Nathan Knox Procedure Date: 11/04/2018 MRN: [id number] Attending MD: Clarene Essex , MD Date of Birth: 04/08/1976 CSN: [id number] Age: 42 Admit Type: Inpatient Procedure:                Colonoscopy Indications:              Hematochezia, Iron deficiency anemia secondary to                            chronic blood loss, Abnormal CT of the GI tract                            reported to me as questionable tumor invading into                            the sigmoid Providers:                Clarene Essex, MD, Debi Mays, RN, Charolette Child,                            Technician, [place] Zenia Resides CRNA, CRNA Referring MD:              Medicines:                Propofol total dose 782 mg IV Complications:            No immediate complications. Estimated Blood Loss:     Estimated blood loss: none. Procedure:                Pre-Anesthesia Assessment:                           - Prior to the procedure, a History and Physical                            was performed, and patient medications and                            allergies were reviewed. The patient's tolerance of                            previous anesthesia was also reviewed. The risks                            and benefits of the procedure and the sedation                            options and risks were discussed with the patient.                            All questions were answered, and informed consent                            was obtained. Prior Anticoagulants: The patient has  taken no previous anticoagulant or antiplatelet                            agents. ASA Grade Assessment: II - A patient with                            mild systemic disease. After reviewing the risks                            and benefits, the patient was deemed in                            satisfactory condition to undergo the procedure.                           After obtaining  informed consent, the colonoscope                            was passed under direct vision. Throughout the                            procedure, the patient's blood pressure, pulse, and                            oxygen saturations were monitored continuously. The                            [id number]-H190DL ([id number]) Olympus peds colonscope was                            introduced through the anus and advanced to the ?                            ileocecal valve. After obtaining informed consent,                            the colonoscope was passed under direct vision.                            Throughout the procedure, the patient's blood                            pressure, pulse, and oxygen saturations were                            monitored continuously. The? ileocecal valve and                            the rectum were photographed. The colonoscopy was                            performed without difficulty. The patient tolerated  the procedure well. The quality of the bowel                            preparation was fair. Scope In: 11:58:31 AM Scope Out: 12:06:30 PM Total Procedure Duration: 0 hours 7 minutes 59 seconds  Findings:      External and internal hemorrhoids were found during retroflexion, during       perianal exam and during digital exam. The hemorrhoids were small.      A moderate amount of semi-liquid stool was found in the entire colon,       interfering with visualization but no blood was seen. Lavage of the area       was performed using a moderate amount of sterile water, resulting in       clearance with fair visualization.      The exam was otherwise without abnormality.      A Mild extrinsic compression mild stenosis was found in the mid sigmoid       colon and was traversed. Impression:               - Preparation of the colon was fair.                           - External and internal hemorrhoids.                           - Stool  in the entire examined colon.                           - The examination was otherwise normal.                           - Stricture in the mid sigmoid colon.                           - No specimens collected. Moderate Sedation:      Not Applicable - Patient had care per Anesthesia. Recommendation:           - Patient has a contact number available for                            emergencies. The signs and symptoms of potential                            delayed complications were discussed with the                            patient. Return to normal activities tomorrow.                            Written discharge instructions were provided to the                            patient.                           - Soft diet today.                           -  Continue present medications.                           - Return to GI clinic PRN. Please let us know if we                            could be of any further assistance with this                            hospital stay                           - Telephone GI clinic if symptomatic PRN. Procedure Code(s):        --- Professional ---                           [phone], Colonoscopy, flexible; diagnostic, including                            collection of specimen(s) by brushing or washing,                            when performed (separate procedure) Diagnosis Code(s):        --- Professional ---                           K64.8, Other hemorrhoids                           K92.1, Melena (includes Hematochezia)                           D50.0, Iron deficiency anemia secondary to blood                            loss (chronic)                           R93.3, Abnormal findings on diagnostic imaging of                            other parts of digestive tract CPT copyright 2018 [employer]. All rights reserved. The codes documented in this report are preliminary and upon coder review may  be revised to meet current  compliance requirements. Clarene Essex, MD 11/04/2018 12:15:57 PM This report has been signed electronically. Number of Addenda: 0

## 2018-11-04 NOTE — Progress Notes (Signed)
Initial Nutrition Assessment  DOCUMENTATION CODES:   Severe malnutrition in context of chronic illness, Underweight  INTERVENTION:  Monitor for diet advances -Ensure Enlive po TID, each supplement provides 350 kcal and 20 grams of protein -Magic cup TID with meals, each supplement provides 290 kcal and 9 grams of protein -Carnation instant breakfast -MVI    NUTRITION DIAGNOSIS:   Severe Malnutrition related to chronic illness(advanced bladder urothelial cancer) as evidenced by moderate fat depletion, severe muscle depletion, percent weight loss.   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 Patient reports 2/3 meals a day and poor historian of dietary recall.   Patient endorses weights trending down recalling a UBW of 200lb before his second surgery ~ 93months ago. RD educated patient and family on ways to increase caloric intake at home and encouraged high protein snacks in between meals.   Per MD note patient elected to stop treatment several months ago.  Repeat CT abdomen pelvis shows:  New right percutaneous nephrostomy tube in appropriate position, with resolution of right hydronephrosis since previous study. Stable large mass within the urinary bladder, consistent with known bladder carcinoma.   Stable left renal mass and enhancing tumor involving the renal collecting system and throughout the left ureter, consistent with  urothelial carcinoma.  Stable left paraaortic and mild bilateral iliac lymphadenopathy, consistent with metastatic disease. No new or progressive metastatic disease identified.  12/13 Colonoscopy results Impression:        - Preparation of the colon was fair.                           - External and internal hemorrhoids.                           - Stool in the entire examined colon.                           - The examination was otherwise normal.                           - Stricture in the mid sigmoid colon.                           - No specimens collected.   NUTRITION - FOCUSED PHYSICAL EXAM:    Most Recent Value  Orbital Region  Moderate depletion  Upper Arm Region  Moderate depletion  Thoracic and Lumbar Region  Severe depletion  Buccal Region  Moderate depletion  Temple Region  Severe depletion  Clavicle Bone Region  Moderate depletion  Clavicle and Acromion Bone Region  Moderate depletion  Scapular Bone Region  Moderate depletion  Dorsal Hand  Moderate depletion  Patellar Region  Moderate depletion  Anterior Thigh Region  Severe depletion  Posterior Calf Region  Severe depletion  Edema (RD Assessment)  None  Hair  Reviewed  Eyes  Reviewed  Mouth  Reviewed  Skin  Reviewed  Nails  Reviewed [yellow]       Diet Order:   Diet Order            DIET SOFT Room service appropriate? Yes; Fluid consistency: Thin  Diet effective now              EDUCATION NEEDS:   Education needs have been addressed  Skin:  Skin Assessment: Reviewed RN Assessment  Last BM:  11/03/2018: Type 5; small; brown, red  Height:   Ht Readings from Last 1 Encounters:  11/04/18 6\' 2"  (1.88 m)    Weight:   Wt Readings from Last 1 Encounters:  11/04/18 61.8 kg    Ideal Body Weight:  82.2 kg  BMI:  Body mass index is 17.49 kg/m.  Estimated Nutritional Needs:   Kcal:  1007-1219 (30-35kcal/kg)  Protein:  87-100g/day  Fluid:  </= 2.1L/day    Lajuan Lines, RD,  LDN  After Hours/Weekend Pager: [phone]

## 2018-11-04 NOTE — Progress Notes (Signed)
Received report from Loney Loh at [facility] [place]. Pt arrived to unit at 0137. Complaints of flank pain around nephrostomy sites. Rates pain at 8. Paged admitting for admission orders.

## 2018-11-04 NOTE — Progress Notes (Signed)
Pharmacy Antibiotic Note  Nathan Knox is a 42 y.o. male transferred from [facility] on 11/04/2018 with GI Bleed.  PMH significant for bladder CA and he has bilateral nephrostomy tubes.  He has been on Rocephin for UTI which was broadened to Zosyn on 12/8 for fevers.  Patient spiked fever this afternoon.  Pharmacy has been consulted for Vancomycin dosing.    11/04/2018:  Tm 101.40F  WBC elevated  Renal function at baseline  Plan: Zosyn per MD Vancomycin 1250mg  IV q24h to target AUC 400-500 Daily Scr Monitor renal function and cx data    Height: 6\' 2"  (188 cm) Weight: 136 lb 3.9 oz (61.8 kg) IBW/kg (Calculated) : 82.2  Temp (24hrs), Avg:98.9 F (37.2 C), Min:98.1 F (36.7 C), Max:101.6 F (38.7 C)  Recent Labs  Lab 11/04/18 0306 11/04/18 0307 11/04/18 0946 11/04/18 1410  WBC 31.4*  --  32.5* 35.9*  CREATININE  --  1.13  --   --     Estimated Creatinine Clearance: 76 mL/min (by C-G formula based on SCr of 1.13 mg/dL).    Allergies  Allergen Reactions  . Anesthesia S-I-60     Hyperthermia, coma-like statw    Antimicrobials this admission: 12/8 Zosyn >>  12/13 Vanc >>  Rocephin>>12/8  Dose adjustments this admission:  Microbiology results: 12/13 BCx:  12/13 UCx:    Thank you for allowing pharmacy to be a part of this patient's care.  Biagio Borg 11/04/2018 7:20 PM

## 2018-11-04 NOTE — Anesthesia Preprocedure Evaluation (Addendum)
Anesthesia Evaluation  Patient identified by MRN, date of birth, ID band Patient awake    Reviewed: Allergy & Precautions, NPO status , Patient's Chart, lab work & pertinent test results  History of Anesthesia Complications Negative for: history of anesthetic complications  Airway Mallampati: II  TM Distance: >3 FB Neck ROM: Full    Dental no notable dental hx. (+) Dental Advisory Given   Pulmonary Current Smoker,    Pulmonary exam normal        Cardiovascular negative cardio ROS Normal cardiovascular exam     Neuro/Psych PSYCHIATRIC DISORDERS Schizophrenia negative neurological ROS     GI/Hepatic Neg liver ROS,   Endo/Other    Renal/GU Renal InsufficiencyRenal diseaseAdvanced bladder cancer     Musculoskeletal   Abdominal   Peds  Hematology  (+) anemia ,   Anesthesia Other Findings   Reproductive/Obstetrics                            Anesthesia Physical Anesthesia Plan  ASA: III  Anesthesia Plan: MAC   Post-op Pain Management:    Induction: Intravenous  PONV Risk Score and Plan: Ondansetron, Propofol infusion and Treatment may vary due to age or medical condition  Airway Management Planned: Natural Airway and Simple Face Mask  Additional Equipment:   Intra-op Plan:   Post-operative Plan: Extubation in OR  Informed Consent: I have reviewed the patients History and Physical, chart, labs and discussed the procedure including the risks, benefits and alternatives for the proposed anesthesia with the patient or authorized representative who has indicated his/her understanding and acceptance.   Dental advisory given  Plan Discussed with: CRNA and Anesthesiologist  Anesthesia Plan Comments:        Anesthesia Quick Evaluation

## 2018-11-04 NOTE — Anesthesia Procedure Notes (Signed)
Procedure Name: MAC Date/Time: 11/04/2018 11:55 AM Performed by: Lollie Sails, CRNA Pre-anesthesia Checklist: Patient identified, Emergency Drugs available, Suction available, Patient being monitored and Timeout performed Oxygen Delivery Method: Simple face mask

## 2018-11-05 DIAGNOSIS — Z515 Encounter for palliative care: Secondary | ICD-10-CM

## 2018-11-05 DIAGNOSIS — Z720 Tobacco use: Secondary | ICD-10-CM

## 2018-11-05 DIAGNOSIS — F17200 Nicotine dependence, unspecified, uncomplicated: Secondary | ICD-10-CM

## 2018-11-05 DIAGNOSIS — F209 Schizophrenia, unspecified: Secondary | ICD-10-CM

## 2018-11-05 DIAGNOSIS — Z884 Allergy status to anesthetic agent status: Secondary | ICD-10-CM

## 2018-11-05 DIAGNOSIS — C679 Malignant neoplasm of bladder, unspecified: Secondary | ICD-10-CM

## 2018-11-05 DIAGNOSIS — D72829 Elevated white blood cell count, unspecified: Secondary | ICD-10-CM

## 2018-11-05 DIAGNOSIS — M545 Low back pain: Secondary | ICD-10-CM

## 2018-11-05 DIAGNOSIS — N182 Chronic kidney disease, stage 2 (mild): Secondary | ICD-10-CM

## 2018-11-05 DIAGNOSIS — R509 Fever, unspecified: Secondary | ICD-10-CM

## 2018-11-05 DIAGNOSIS — Z7189 Other specified counseling: Secondary | ICD-10-CM

## 2018-11-05 DIAGNOSIS — D509 Iron deficiency anemia, unspecified: Secondary | ICD-10-CM

## 2018-11-05 DIAGNOSIS — Z936 Other artificial openings of urinary tract status: Secondary | ICD-10-CM

## 2018-11-05 LAB — CBC WITH DIFFERENTIAL/PLATELET
Abs Immature Granulocytes: 0.28 10*3/uL — ABNORMAL HIGH (ref 0.00–0.07)
Basophils Absolute: 0.1 10*3/uL (ref 0.0–0.1)
Basophils Relative: 0 %
EOS ABS: 0.1 10*3/uL (ref 0.0–0.5)
Eosinophils Relative: 0 %
HCT: 26.3 % — ABNORMAL LOW (ref 39.0–52.0)
Hemoglobin: 7.9 g/dL — ABNORMAL LOW (ref 13.0–17.0)
Immature Granulocytes: 1 %
Lymphocytes Relative: 7 %
Lymphs Abs: 2.3 10*3/uL (ref 0.7–4.0)
MCH: 25.3 pg — ABNORMAL LOW (ref 26.0–34.0)
MCHC: 30 g/dL (ref 30.0–36.0)
MCV: 84.3 fL (ref 80.0–100.0)
Monocytes Absolute: 1.4 10*3/uL — ABNORMAL HIGH (ref 0.1–1.0)
Monocytes Relative: 5 %
Neutro Abs: 27.5 10*3/uL — ABNORMAL HIGH (ref 1.7–7.7)
Neutrophils Relative %: 87 %
Platelets: 440 10*3/uL — ABNORMAL HIGH (ref 150–400)
RBC: 3.12 MIL/uL — ABNORMAL LOW (ref 4.22–5.81)
RDW: 18.8 % — ABNORMAL HIGH (ref 11.5–15.5)
WBC: 31.7 10*3/uL — ABNORMAL HIGH (ref 4.0–10.5)
nRBC: 0 % (ref 0.0–0.2)

## 2018-11-05 LAB — URINE CULTURE: Culture: <10000 — AB

## 2018-11-05 LAB — COMPREHENSIVE METABOLIC PANEL
ALK PHOS: 79 U/L (ref 38–126)
ALT: 14 U/L (ref 0–44)
AST: 15 U/L (ref 15–41)
Albumin: 2.1 g/dL — ABNORMAL LOW (ref 3.5–5.0)
Anion gap: 8 (ref 5–15)
BILIRUBIN TOTAL: 0.6 mg/dL (ref 0.3–1.2)
BUN: 11 mg/dL (ref 6–20)
CO2: 23 mmol/L (ref 22–32)
Calcium: 10.8 mg/dL — ABNORMAL HIGH (ref 8.9–10.3)
Chloride: 106 mmol/L (ref 98–111)
Creatinine, Ser: 1.03 mg/dL (ref 0.61–1.24)
GFR calc Af Amer: >60 mL/min (ref >60)
GFR calc non Af Amer: >60 mL/min (ref >60)
Glucose, Bld: 122 mg/dL — ABNORMAL HIGH (ref 70–99)
Potassium: 3.8 mmol/L (ref 3.5–5.1)
SODIUM: 137 mmol/L (ref 135–145)
TOTAL PROTEIN: 6.5 g/dL (ref 6.5–8.1)

## 2018-11-05 LAB — CBC
HCT: 27.8 % — ABNORMAL LOW (ref 39.0–52.0)
Hemoglobin: 8.3 g/dL — ABNORMAL LOW (ref 13.0–17.0)
MCH: 25.3 pg — AB (ref 26.0–34.0)
MCHC: 29.9 g/dL — ABNORMAL LOW (ref 30.0–36.0)
MCV: 84.8 fL (ref 80.0–100.0)
Platelets: 414 10*3/uL — ABNORMAL HIGH (ref 150–400)
RBC: 3.28 MIL/uL — ABNORMAL LOW (ref 4.22–5.81)
RDW: 18.8 % — ABNORMAL HIGH (ref 11.5–15.5)
WBC: 32.5 10*3/uL — ABNORMAL HIGH (ref 4.0–10.5)
nRBC: 0 % (ref 0.0–0.2)

## 2018-11-05 LAB — CREATININE, SERUM
Creatinine, Ser: 1.06 mg/dL (ref 0.61–1.24)
GFR calc Af Amer: >60 mL/min (ref >60)
GFR calc non Af Amer: >60 mL/min (ref >60)

## 2018-11-05 LAB — PROCALCITONIN: Procalcitonin: 4.09 ng/mL

## 2018-11-05 MED ORDER — SODIUM CHLORIDE 0.9 % IV BOLUS
1000.0000 mL | Freq: Once | INTRAVENOUS | Status: AC
  Administered 2018-11-05: 1000 mL via INTRAVENOUS

## 2018-11-05 NOTE — Progress Notes (Signed)
[facility] Gastroenterology Progress Note  Nathan Knox 42 y.o. May 11, 1976   Subjective: Reports suprapubic pain. Loose nonbloody stools overnight. Lying in bed eating fast food. Family in room.   Objective: Vital signs: Vitals:   11/04/18 2218 11/05/18 0500  BP: (!) 108/59 (!) 102/51  Knox: 99 (!) 103  Resp: 20   Temp: 98 F (36.7 C)   SpO2: 99%     Physical Exam: Gen: lethargic, thin, no acute distress  HEENT: anicteric sclera CV: RRR Chest: CTA B Abd: LLQ and suprapubic tenderness with guarding, otherwise nontender, soft, nondistended, +BS Ext: no edema  Lab Results: Recent Labs    11/04/18 0307 11/05/18 0219 11/05/18 0815  NA 136  --  137  K 3.9  --  3.8  CL 105  --  106  CO2 23  --  23  GLUCOSE 116*  --  122*  BUN 10  --  11  CREATININE 1.13 1.06 1.03  CALCIUM 11.2*  --  10.8*   Recent Labs    11/05/18 0815  AST 15  ALT 14  ALKPHOS 79  BILITOT 0.6  PROT 6.5  ALBUMIN 2.1*   Recent Labs    11/04/18 0306  11/05/18 0219 11/05/18 0815  WBC 31.4*   < > 32.5* 31.7*  NEUTROABS 26.4*  --   --  27.5*  HGB 8.4*   < > 8.3* 7.9*  HCT 28.0*   < > 27.8* 26.3*  MCV 85.9   < > 84.8 84.3  PLT 404*   < > 414* 440*   < > = values in this interval not displayed.      Assessment/Plan: GI bleed that seems to have resolved. S/P colonoscopy yesterday with sigmoid colon narrowing seen without source seen but suspect relation to bladder cancer. External and internal hemorrhoids noted. Leucocytosis persisting and fever to 101.6 yesterday with tachycardia. On IV Zosyn. Dispo per primary team. No further GI workup needed. Will sign off. Call if questions.   [name] 11/05/2018, 11:53 AM  Questions please call [phone] ID: Nathan Knox, male   DOB: Jul 30, 1976, 42 y.o.   MRN: [id number]

## 2018-11-05 NOTE — Consult Note (Signed)
Day 3 abtx - day 3 piptazo/day 2 vanco         Reason for Consult:leukocytosis    Referring Physician: fever  Principal Problem:   GI bleeding Active Problems:   Bladder cancer ([place])   CKD (chronic kidney disease), stage II   Microcytic anemia   Schizophrenia (HCC)   Tobacco abuse   UTI (urinary tract infection)   Sepsis (HCC)   GIB (gastrointestinal bleeding)   Protein-calorie malnutrition, severe (HCC)    HPI: Nathan Knox is a 42 y.o. male with hx of schizophrenia, ckd 2, and advanced bladder urothelial cancer s/p not currently on treatment, s/p bilateral nephrostomy initially presented to [facility] with c/b back pain and GIB and c/o right nephrostomy tube dysfunction. He developed severe, constant and sharp back pain. IR was consulted and exchanged his L nephrostomy catheter and placed a new R nephrostomy catheter. They did a left nephrostogram on 12/6 due to concern for lack of output, but it was noted to be in good position. Repeat CT abdomen pelvis shows new right-sided percutaneous nephrostomy, stable large urinary bladder carcinoma, stable left renal mass and lymphadenopathy. He was started on ceftriaxone due to concern for UTI but cx were negative but had ongoing fevers thus abtx changed to piptazo. His leukocytosis remained persistently elevated from 24K to 30K. He was transferred to [facility] for evaluation of GIB. He was seen by Dr Watt Climes who performed colonoscopy which showed narrowing without source of bleeding but suspected it related to bladder cancer. Patient remains febrile thus ID asked to evaluate and provide recommendations.  Oncology team at [facility]: onc hx Diagnosis:  Stage IVA, T4a N1 M1a bladder cancer,  The patient was diagnosed with early stage disease in 2004 but was lost to follow up for 15 years.  Treatment: TURBT x2.  05/2018 MVAC x2- dose dense However, after 2 cycles, the patient reported intolerance due to toxicity on the basis of generalized  fatigue, nausea, dizziness, constipation. Therefore, PAC removed. Reports last cycle was sometime in 06/2018.   Last seen by dr Archie Balboa on 11/20 discussed that he would like palliative treatment and not hospice. possibly immune checkpoint inhibitor such as pembrolizumab is reasonable. He was consented today. Will consider testing for FGFR mutation in the future, if present erdafitinib can be considered .   Past Medical History:  Diagnosis Date  . CKD (chronic kidney disease), stage II   . Microcytic anemia   . Schizophrenia ([place])   . Tobacco abuse     Allergies:  Allergies  Allergen Reactions  . Anesthesia S-I-60     Hyperthermia, coma-like statw    MEDICATIONS: . cloZAPine  100 mg Oral q morning - 10a  . cloZAPine  200 mg Oral QHS  . feeding supplement (ENSURE ENLIVE)  237 mL Oral TID BM  . ferrous sulfate  325 mg Oral BID WC  . multivitamin  1 tablet Oral Daily  . nicotine  21 mg Transdermal Daily  . pantoprazole  40 mg Oral BID  . protein supplement  2 oz Oral QID    Social History   Tobacco Use  . Smoking status: Current Every Day Smoker  . Smokeless tobacco: Never Used  Substance Use Topics  . Alcohol use: Never    Frequency: Never  . Drug use: Never    Family History  Problem Relation Age of Onset  . COPD Mother   . Hypertension Father     Review of Systems  Constitutional: positive for fever, chills, diaphoresis,  activity change, appetite change, fatigue and unexpected weight change.  HENT: Negative for congestion, sore throat, rhinorrhea, sneezing, trouble swallowing and sinus pressure.  Eyes: Negative for photophobia and visual disturbance.  Respiratory: Negative for cough, chest tightness, shortness of breath, wheezing and stridor.  Cardiovascular: Negative for chest pain, palpitations and leg swelling.  Gastrointestinal: Negative for nausea, vomiting, abdominal pain, diarrhea, constipation, blood in stool, abdominal distention and anal bleeding.    Genitourinary: Negative for dysuria, hematuria, flank pain and difficulty urinating.  Musculoskeletal: positive back and pelvic pain.Negative for myalgias, back pain, joint swelling, arthralgias and gait problem.  Skin: Negative for color change, pallor, rash and wound.  Neurological: Negative for dizziness, tremors, weakness and light-headedness.  Hematological: Negative for adenopathy. Does not bruise/bleed easily.  Psychiatric/Behavioral: Negative for behavioral problems, confusion, sleep disturbance, dysphoric mood, decreased concentration and agitation.    OBJECTIVE: Temp:  [98 F (36.7 C)-101.6 F (38.7 C)] 98 F (36.7 C) (12/13 2218) Pulse Rate:  [99-117] 103 (12/14 0500) Resp:  [17-28] 20 (12/13 2218) BP: (100-113)/(51-65) 102/51 (12/14 0500) SpO2:  [98 %-100 %] 99 % (12/13 2218) Physical Exam  Constitutional: He is oriented to person, place, and time. He appears cachetic and under-nourished. No distress.  HENT: pale conjunctiva Mouth/Throat: Oropharynx is clear and moist. No oropharyngeal exudate.  Cardiovascular: Normal rate, regular rhythm and normal heart sounds. Exam reveals no gallop and no friction rub.  No murmur heard.  Pulmonary/Chest: Effort normal and breath sounds normal. No respiratory distress. He has no wheezes.  Abdominal: Soft. Bowel sounds are normal. He exhibits no distension. There is no tenderness.  Lymphadenopathy:  He has no cervical adenopathy.  Back= urostomy-B in place-clear yellow urine in collection system Neurological: He is alert and oriented to person, place, and time.  Skin: Skin is warm and dry. No rash noted. No erythema.  Psychiatric: He has a normal mood and affect. His behavior is normal.     LABS: Results for orders placed or performed during the hospital encounter of 11/04/18 (from the past 48 hour(s))  Type and screen [place]     Status: None   Collection Time: 11/04/18  2:49 AM  Result Value Ref Range    ABO/RH(D) B POS    Antibody Screen NEG    Sample Expiration      11/07/2018 Performed at [facility], [place] [address], [place], [place] [address]   ABO/Rh     Status: None   Collection Time: 11/04/18  3:03 AM  Result Value Ref Range   ABO/RH(D)      B POS Performed at [facility], [place] [address], [place], [place] [address]   CBC     Status: Abnormal   Collection Time: 11/04/18  3:06 AM  Result Value Ref Range   WBC 31.4 (H) 4.0 - 10.5 K/uL    Comment: WHITE COUNT CONFIRMED ON SMEAR   RBC 3.26 (L) 4.22 - 5.81 MIL/uL   Hemoglobin 8.4 (L) 13.0 - 17.0 g/dL   HCT 28.0 (L) 39.0 - 52.0 %   MCV 85.9 80.0 - 100.0 fL   MCH 25.8 (L) 26.0 - 34.0 pg   MCHC 30.0 30.0 - 36.0 g/dL   RDW 18.9 (H) 11.5 - 15.5 %   Platelets 404 (H) 150 - 400 K/uL   nRBC 0.0 0.0 - 0.2 %    Comment: Performed at [facility], [place] [address], [place], [place] [address]  Culture, blood (Routine X 2)  w Reflex to ID Panel     Status: None (Preliminary result)   Collection Time: 11/04/18  3:06 AM  Result Value Ref Range   Specimen Description      BLOOD RIGHT HAND Performed at [place] [address], [place], [place] [address]    Special Requests      BOTTLES DRAWN AEROBIC AND ANAEROBIC Blood Culture adequate volume Performed at [facility] [address], [place], [place] [address]    Culture      NO GROWTH 1 DAY Performed at [facility] [address] [place], [place] [address]    Report Status PENDING   Culture, blood (Routine X 2) w Reflex to ID Panel     Status: None (Preliminary result)   Collection Time: 11/04/18  3:06 AM  Result Value Ref Range   Specimen Description      BLOOD LEFT HAND Performed at [facility] [address] [place], [place] [address]    Special Requests      BOTTLES DRAWN AEROBIC ONLY Blood Culture adequate volume Performed at [facility] [address], [place] , [place] [address]    Culture      NO GROWTH 1 DAY Performed at [facility] [address], [place], [place] [address]    Report Status PENDING   Protime-INR     Status: None   Collection Time: 11/04/18  3:06 AM  Result Value Ref Range   Prothrombin Time 15.1 11.4 - 15.2 seconds   INR 1.20     Comment: Performed at [facility], [place] [address] [place], [place] [address]  APTT     Status: None   Collection Time: 11/04/18  3:06 AM  Result Value Ref Range   aPTT 31 24 - 36 seconds    Comment: Performed at [facility], [place] [address] [place], [place] [address]  HIV antibody (Routine Testing)     Status: None   Collection Time: 11/04/18  3:06 AM  Result Value Ref Range   HIV Screen 4th Generation wRfx Non Reactive Non Reactive    Comment: (NOTE) Performed At: [facility] [place], [place] [id number] Rush Farmer MD NU:[id number]   Differential     Status: Abnormal   Collection Time: 11/04/18  3:06 AM  Result Value Ref Range   Neutrophils Relative % 83 %   Neutro Abs 26.4 (H) 1.7 - 7.7 K/uL   Lymphocytes Relative 9 %   Lymphs Abs 2.7 0.7 - 4.0 K/uL   Monocytes Relative 6 %   Monocytes Absolute 1.8 (H) 0.1 - 1.0 K/uL   Eosinophils Relative 1 %   Eosinophils Absolute 0.2 0.0 - 0.5 K/uL   Basophils Relative 0 %   Basophils Absolute 0.1 0.0 - 0.1 K/uL   Immature Granulocytes 1 %   Abs Immature Granulocytes 0.36 (H) 0.00 - 0.07 K/uL    Comment: Performed at [facility], [place] [address], [place], [place] [address]  Basic metabolic panel     Status: Abnormal   Collection Time: 11/04/18  3:07 AM  Result Value Ref Range   Sodium 136 135 - 145 mmol/L   Potassium 3.9 3.5 - 5.1 mmol/L   Chloride 105 98 - 111 mmol/L   CO2 23 22 - 32 mmol/L   Glucose, Bld 116 (H) 70 - 99 mg/dL   BUN 10 6 - 20 mg/dL  Creatinine, Ser 1.13 0.61 - 1.24 mg/dL   Calcium  11.2 (H) 8.9 - 10.3 mg/dL   GFR calc non Af Amer >60 >60 mL/min   GFR calc Af Amer >60 >60 mL/min   Anion gap 8 5 - 15    Comment: Performed at [facility], [place] [address] [place], [place] [address]  CBC     Status: Abnormal   Collection Time: 11/04/18  9:46 AM  Result Value Ref Range   WBC 32.5 (H) 4.0 - 10.5 K/uL   RBC 3.27 (L) 4.22 - 5.81 MIL/uL   Hemoglobin 8.4 (L) 13.0 - 17.0 g/dL   HCT 28.0 (L) 39.0 - 52.0 %   MCV 85.6 80.0 - 100.0 fL   MCH 25.7 (L) 26.0 - 34.0 pg   MCHC 30.0 30.0 - 36.0 g/dL   RDW 18.8 (H) 11.5 - 15.5 %   Platelets 411 (H) 150 - 400 K/uL   nRBC 0.0 0.0 - 0.2 %    Comment: Performed at [facility], [place] [address], [place], [place] [address]  Urine Culture     Status: Abnormal   Collection Time: 11/04/18 11:36 AM  Result Value Ref Range   Specimen Description      URINE, CLEAN CATCH Performed at [facility], [place] [address], [place], [place] [address]    Special Requests      NONE Performed at [facility], [place] [address] [place], [place] [address]    Culture (A)     <10,000 COLONIES/mL INSIGNIFICANT GROWTH Performed at [facility] [address] [place], [place] [address]    Report Status 11/05/2018 FINAL   Urinalysis, Routine w reflex microscopic     Status: Abnormal   Collection Time: 11/04/18 11:36 AM  Result Value Ref Range   Color, Urine STRAW (A) YELLOW   APPearance TURBID (A) CLEAR   Specific Gravity, Urine 1.026 1.005 - 1.030   pH 7.0 5.0 - 8.0   Glucose, UA NEGATIVE NEGATIVE mg/dL   Hgb urine dipstick SMALL (A) NEGATIVE   Bilirubin Urine NEGATIVE NEGATIVE   Ketones, ur NEGATIVE NEGATIVE mg/dL   Protein, ur 100 (A) NEGATIVE mg/dL   Nitrite NEGATIVE NEGATIVE   Leukocytes, UA MODERATE (A) NEGATIVE   RBC / HPF >50 (H) 0 - 5 RBC/hpf   WBC, UA >50 (H) 0 - 5 WBC/hpf   Bacteria, UA MANY (A) NONE SEEN   Squamous Epithelial / LPF 0-5 0 - 5   WBC Clumps  PRESENT    Mucus PRESENT     Comment: Performed at [facility], [place] [address] [place] [address] [place], [place] [address]  CBC     Status: Abnormal   Collection Time: 11/04/18  2:10 PM  Result Value Ref Range   WBC 35.9 (H) 4.0 - 10.5 K/uL   RBC 3.32 (L) 4.22 - 5.81 MIL/uL   Hemoglobin 8.4 (L) 13.0 - 17.0 g/dL   HCT 28.7 (L) 39.0 - 52.0 %   MCV 86.4 80.0 - 100.0 fL   MCH 25.3 (L) 26.0 - 34.0 pg   MCHC 29.3 (L) 30.0 - 36.0 g/dL   RDW 18.9 (H) 11.5 - 15.5 %   Platelets 420 (H) 150 - 400 K/uL   nRBC 0.0 0.0 - 0.2 %    Comment: Performed at [facility], [place] [address], [place], [place] [address]  CBC     Status: Abnormal   Collection Time: 11/04/18  7:38  PM  Result Value Ref Range   WBC 34.4 (H) 4.0 - 10.5 K/uL   RBC 2.97 (L) 4.22 - 5.81 MIL/uL   Hemoglobin 7.6 (L) 13.0 - 17.0 g/dL   HCT 25.3 (L) 39.0 - 52.0 %   MCV 85.2 80.0 - 100.0 fL   MCH 25.6 (L) 26.0 - 34.0 pg   MCHC 30.0 30.0 - 36.0 g/dL   RDW 18.7 (H) 11.5 - 15.5 %   Platelets 393 150 - 400 K/uL   nRBC 0.0 0.0 - 0.2 %    Comment: Performed at [facility], [place] [address] [place], [place] [address]  Lactic acid, plasma     Status: None   Collection Time: 11/04/18  7:38 PM  Result Value Ref Range   Lactic Acid, Venous 0.7 0.5 - 1.9 mmol/L    Comment: Performed at [facility], [place] [address], [place], [place] [address]  Procalcitonin     Status: None   Collection Time: 11/04/18  7:38 PM  Result Value Ref Range   Procalcitonin 4.44 ng/mL    Comment:        Interpretation: PCT > 2 ng/mL: Systemic infection (sepsis) is likely, unless other causes are known. (NOTE)       Sepsis PCT Algorithm           Lower Respiratory Tract                                      Infection PCT Algorithm    ----------------------------     ----------------------------         PCT < 0.25 ng/mL                PCT < 0.10 ng/mL         Strongly encourage             Strongly  discourage   discontinuation of antibiotics    initiation of antibiotics    ----------------------------     -----------------------------       PCT 0.25 - 0.50 ng/mL            PCT 0.10 - 0.25 ng/mL               OR       >80% decrease in PCT            Discourage initiation of                                            antibiotics      Encourage discontinuation           of antibiotics    ----------------------------     -----------------------------         PCT >= 0.50 ng/mL              PCT 0.26 - 0.50 ng/mL               AND       <80% decrease in PCT              Encourage initiation of  antibiotics       Encourage continuation           of antibiotics    ----------------------------     -----------------------------        PCT >= 0.50 ng/mL                  PCT > 0.50 ng/mL               AND         increase in PCT                  Strongly encourage                                      initiation of antibiotics    Strongly encourage escalation           of antibiotics                                     -----------------------------                                           PCT <= 0.25 ng/mL                                                 OR                                        > 80% decrease in PCT                                     Discontinue / Do not initiate                                             antibiotics Performed at [place] [address] [place], [place] [address]   Protime-INR     Status: Abnormal   Collection Time: 11/04/18  7:38 PM  Result Value Ref Range   Prothrombin Time 15.4 (H) 11.4 - 15.2 seconds   INR 1.24     Comment: Performed at [facility], [place] [address] [place] [address], [place], [place] [address]  APTT     Status: Abnormal   Collection Time: 11/04/18  7:38 PM  Result Value Ref Range   aPTT 42 (H) 24 - 36 seconds    Comment:        IF BASELINE aPTT IS  ELEVATED, SUGGEST PATIENT RISK ASSESSMENT BE USED TO DETERMINE APPROPRIATE ANTICOAGULANT THERAPY. Performed at [facility], [place] [address], [place], [place] [address]   Lactic acid, plasma     Status: None   Collection Time: 11/04/18 10:06 PM  Result Value Ref Range   Lactic Acid, Venous 1.5 0.5 - 1.9 mmol/L  Comment: Performed at [facility], [place] [address] [place], [place] [address]  CBC     Status: Abnormal   Collection Time: 11/05/18  2:19 AM  Result Value Ref Range   WBC 32.5 (H) 4.0 - 10.5 K/uL   RBC 3.28 (L) 4.22 - 5.81 MIL/uL   Hemoglobin 8.3 (L) 13.0 - 17.0 g/dL   HCT 27.8 (L) 39.0 - 52.0 %   MCV 84.8 80.0 - 100.0 fL   MCH 25.3 (L) 26.0 - 34.0 pg   MCHC 29.9 (L) 30.0 - 36.0 g/dL   RDW 18.8 (H) 11.5 - 15.5 %   Platelets 414 (H) 150 - 400 K/uL   nRBC 0.0 0.0 - 0.2 %    Comment: Performed at [facility], [place] [address], [place], [place] [address]  Creatinine, serum     Status: None   Collection Time: 11/05/18  2:19 AM  Result Value Ref Range   Creatinine, Ser 1.06 0.61 - 1.24 mg/dL   GFR calc non Af Amer >60 >60 mL/min   GFR calc Af Amer >60 >60 mL/min    Comment: Performed at [facility], [place] [address], [place], [place] [address]  Comprehensive metabolic panel     Status: Abnormal   Collection Time: 11/05/18  8:15 AM  Result Value Ref Range   Sodium 137 135 - 145 mmol/L   Potassium 3.8 3.5 - 5.1 mmol/L   Chloride 106 98 - 111 mmol/L   CO2 23 22 - 32 mmol/L   Glucose, Bld 122 (H) 70 - 99 mg/dL   BUN 11 6 - 20 mg/dL   Creatinine, Ser 1.03 0.61 - 1.24 mg/dL   Calcium 10.8 (H) 8.9 - 10.3 mg/dL   Total Protein 6.5 6.5 - 8.1 g/dL   Albumin 2.1 (L) 3.5 - 5.0 g/dL   AST 15 15 - 41 U/L   ALT 14 0 - 44 U/L   Alkaline Phosphatase 79 38 - 126 U/L   Total Bilirubin 0.6 0.3 - 1.2 mg/dL   GFR calc non Af Amer >60 >60 mL/min   GFR calc Af Amer >60 >60 mL/min   Anion gap 8 5 - 15    Comment:  Performed at [facility], [place] [address] [place], [place] [address]  CBC with Differential/Platelet     Status: Abnormal   Collection Time: 11/05/18  8:15 AM  Result Value Ref Range   WBC 31.7 (H) 4.0 - 10.5 K/uL   RBC 3.12 (L) 4.22 - 5.81 MIL/uL   Hemoglobin 7.9 (L) 13.0 - 17.0 g/dL   HCT 26.3 (L) 39.0 - 52.0 %   MCV 84.3 80.0 - 100.0 fL   MCH 25.3 (L) 26.0 - 34.0 pg   MCHC 30.0 30.0 - 36.0 g/dL   RDW 18.8 (H) 11.5 - 15.5 %   Platelets 440 (H) 150 - 400 K/uL   nRBC 0.0 0.0 - 0.2 %   Neutrophils Relative % 87 %   Neutro Abs 27.5 (H) 1.7 - 7.7 K/uL   Lymphocytes Relative 7 %   Lymphs Abs 2.3 0.7 - 4.0 K/uL   Monocytes Relative 5 %   Monocytes Absolute 1.4 (H) 0.1 - 1.0 K/uL   Eosinophils Relative 0 %   Eosinophils Absolute 0.1 0.0 - 0.5 K/uL   Basophils Relative 0 %   Basophils Absolute 0.1 0.0 - 0.1 K/uL   RBC Morphology MORPHOLOGY UNREMARKABLE    Immature Granulocytes 1 %   Abs Immature  Granulocytes 0.28 (H) 0.00 - 0.07 K/uL    Comment: Performed at [facility], [place] [address] [place], [place] [address]  Procalcitonin - Baseline     Status: None   Collection Time: 11/05/18  8:15 AM  Result Value Ref Range   Procalcitonin 4.09 ng/mL    Comment:        Interpretation: PCT > 2 ng/mL: Systemic infection (sepsis) is likely, unless other causes are known. (NOTE)       Sepsis PCT Algorithm           Lower Respiratory Tract                                      Infection PCT Algorithm    ----------------------------     ----------------------------         PCT < 0.25 ng/mL                PCT < 0.10 ng/mL         Strongly encourage             Strongly discourage   discontinuation of antibiotics    initiation of antibiotics    ----------------------------     -----------------------------       PCT 0.25 - 0.50 ng/mL            PCT 0.10 - 0.25 ng/mL               OR       >80% decrease in PCT            Discourage initiation of                                             antibiotics      Encourage discontinuation           of antibiotics    ----------------------------     -----------------------------         PCT >= 0.50 ng/mL              PCT 0.26 - 0.50 ng/mL               AND       <80% decrease in PCT              Encourage initiation of                                             antibiotics       Encourage continuation           of antibiotics    ----------------------------     -----------------------------        PCT >= 0.50 ng/mL                  PCT > 0.50 ng/mL               AND         increase in PCT                  Strongly encourage  initiation of antibiotics    Strongly encourage escalation           of antibiotics                                     -----------------------------                                           PCT <= 0.25 ng/mL                                                 OR                                        > 80% decrease in PCT                                     Discontinue / Do not initiate                                             antibiotics Performed at [facility] [address] [place], [place] [address]     MICRO: 12/13 blood cx ngtd 12/13 urine cx 10,000 mixed colonies IMAGING: Dg Chest 2 View  Result Date: 11/04/2018 CLINICAL DATA:  Leukocytosis EXAM: CHEST - 2 VIEW COMPARISON:  10/30/2018 FINDINGS: Normal heart size, mediastinal contours, and pulmonary vascularity. Lungs clear. No pleural effusion or pneumothorax. Bones unremarkable. IMPRESSION: Normal exam. Electronically Signed   By: Lavonia Dana M.D.   On: 11/04/2018 17:46    Assessment/Plan:  42yo M with advance bladder cancer - with worsening progression presenting with low back pain, fevers, GIB.  Suspect leukocytosis is due to malignancy and inflammation.   Continue on piptazo for now for complicated urinary source infection given tumor invasion  and ureter obstruction from initial presentation on 12/4- currently day 9 of abtx. Would treat for a total of 14d. Can switch to orals if he remains afebrile.  Can d/c vancomycin in next 24hr if no positive cultures arise  Bladder cancer = would recommend to get in touch with oncology- Dr Archie Balboa to see if can be seen this coming week vs. [facility] ??

## 2018-11-05 NOTE — Progress Notes (Signed)
PROGRESS NOTE    Nathan Knox  ACZ:[id number] DOB: 03/31/1976 DOA: 11/04/2018 PCP: Patient, No Pcp Per    Brief Narrative:  HPI per Dr Derry Lory is a 42 y.o. male with medical history significant of advanced bladder urothelial cancer (elevated to stop treatment few months ago), s/p of bilateral nephrostomy tube placement, schizophrenia, CKD-2, who is transfused from [facility] to [facility] due to GIB.  Pt has hx of bladder cancer, status post bladder cancer removal, and s/p of bilateral nephrostomy tubes placed 6 weeks ago at [facility]. He elected to stop treatment for several months ago. Pt was initially admitted to [facility] due to GI bleeding and back pain on 10/26/2018.  Pt states that his right nephrostomy tube stop having any output several weeks ago. He developed severe, constant and sharp back pain. IR was consulted and exchanged his L nephrostomy catheter and placed a new R nephrostomy catheter. They did a left nephrostogram on 12/6 due to concern for lack of output, but it was noted to be in good position. Repeat CT abdomen pelvis shows new right-sided percutaneous nephrostomy, stable large urinary bladder carcinoma, stable left renal mass and lymphadenopathy.   He was found to have sepsis due to UTI. He's been initially treated with ceftriaxone for presumed UTI. Urine cx returned negative. Urology was c/s, but nothing to offer from surgical standpoint. He had recurrent fever on 12/8. He was recultured and abx broadened to zosyn. His fever has resolved. Today his body temperature is 98.7.  He had leukocytosis with WBC 24, which has been persistent. This morning WBC was 30, which was thought to be partially due to cancer.  Pt also has rectal bleeding with bright red blood.  His hemoglobin dropped from 7.7 on 09/23/18 to 6.0. MCV of 73. Normal lactic acid level. Patient was transfused with blood, hemoglobin back to 8.2 this morning. Gastroenterology was  consulted. Gastroenterology was planning a colonoscopy, however on the day of procedure patient refused. He later changed his mind, however gastroenterology no longer has coverage in the hospital for the next 3 weeks. Patient is transferred to [facility].  Dr. Watt Climes of GI was consulted.   Radiology Studies: CT scan 12/5 1. There is a masslike rounded region of decreased attenuation in the left kidney measuring 5.1 cm. There is adjacent fat stranding. Whether this masslike region represents tumor or a phlegmon/infection cannot be determined with confidence. Focal pyelonephritis with phlegmon formation is certainly possible. 2. The patient has bilateral nephrostomy tubes. Both tubes are near the periphery of the kidneys. I suspect the right tube has been partially pulled back and there is mild right-sided hydronephrosis suggesting the right nephrostomy tube may not be working correctly. The degree of hydronephrosis on the left however is much less in the interval suggesting the left nephrostomy tube is working.  3. The patient's known large bladder tumor is significantly larger in the interval. There appears to be tumor extending nearly the entire length of the left ureter. No excretion from the left kidney on delayed imaging. 4. Worsening adenopathy in the retroperitoneum. 5. A small rounded region of low attenuation in the liver is nonspecific. A small metastasis is not excluded. Recommend attention on follow-up.  Repeat CT abdomen pelvis shows:  New right percutaneous nephrostomy tube in appropriate position, with resolution of right hydronephrosis since previous study. Stable large mass within the urinary bladder, consistent with known bladder carcinoma.   Stable left renal mass and enhancing tumor involving the renal collecting  system and throughout the left ureter, consistent with urothelial carcinoma.  Stable left paraaortic and mild bilateral iliac lymphadenopathy, consistent with metastatic  disease.  No new or progressive metastatic disease identified.  Assessment & Plan:   Principal Problem:   GI bleeding Active Problems:   Sepsis ([place])   Bladder cancer ([place])   CKD (chronic kidney disease), stage II   Microcytic anemia   Schizophrenia (HCC)   Tobacco abuse   UTI (urinary tract infection)   GIB (gastrointestinal bleeding)   Protein-calorie malnutrition, severe (HCC)  1 GI bleed/microcytic anemia/anemia of chronic disease/rectal bleeding Patient initially hospitalized at outside hospital for rectal bleeding.  Hemoglobin was 7.7 on 09/23/2018 which dropped down to 6.0 on 10/26/2018.  Patient transfused 2 units packed red blood cells at outside hospital hemoglobin on presentation was 8.2.  Patient noted to have borderline blood pressure.  GI consulted patient placed on IV fluids as well as PPI.  Patient underwent colonoscopy which showed sigmoid colon narrowing seen without any source of bleeding, internal and external hemorrhoids noted.  Patient with no overt bleeding.  Hemoglobin seems stable currently at 7.9.  Patient seen by GI and no further work-up recommended at this time.  Follow H&H.  2.  Sepsis Questionable etiology.  Patient noted to have sepsis at outside hospital initially felt secondary to UTI.  Patient was initially on IV Rocephin which was subsequently discontinued however patient became febrile and placed on broad-spectrum antibiotics of IV Zosyn at outside hospital prior to transfer.  Patient with a significant leukocytosis with WBC as high as 35.9 during this hospitalization.  Patient noted to have a fever of 101.4 yesterday evening.  Patient pancultured results pending.  Urinalysis with turbid, moderate leukocytes, nitrite negative, greater than 50 WBCs.  Urine cultures pending.  Patient placed on IV fluids.  IV antibiotic coverage was broadened and patient started on vancomycin in addition to Zosyn.  Due to patient's history of bladder cancer status post  nephrostomy tubes was on chemotherapy and refusing any further chemotherapy over the past few months will consult with ID for further evaluation and recommendations.  3.  Fever/leukocytosis Questionable etiology.  Concerns for sepsis.  Patient has been pancultured.  Patient empirically on IV vancomycin and Zosyn during this hospitalization.  Prior to this hospitalization patient had about 12 days of IV antibiotics with no source noted.  Concerned that patient's fever and leukocytosis may be secondary to malignancy.  Consulted ID for further evaluation and management.  4.  Severe protein calorie malnutrition Secondary to malignancy.  Ensure supplementation.  5.  History of schizophrenia Stable.  Continue clozapine.  6.  Stage IV bladder carcinoma Status post bladder cancer removal.  Status post exchange of left nephrostomy catheter and placement of right sided new nephrostomy on 10/27/2018.  CT abdomen and pelvis done at outside hospital shows bladder cancer stable but advanced with lymphadenopathy.  Patient has refused chemotherapy and immunotherapy.  Concern of noncompliance.  Patient does not want any further treatment at this time when questioned.  Palliative care consultation pending for goals of care.  7.  Chronic kidney disease stage II Stable.  Follow.  8.  Tobacco abuse Tobacco cessation.  Continue nicotine patch.   DVT prophylaxis: SCDs Code Status: Full Family Communication: Updated patient and mother at bedside. Disposition Plan: To be determined.  Likely home when medically stable, afebrile x24 to 48 hours with clinical improvement.   Consultants:   Gastroenterology: Dr. Watt Climes 11/04/2018   ID pending  Palliative care pending  Procedures:   Chest x-ray 11/04/2018  Colonoscopy 11/04/2018 per Dr. Watt Climes  Antimicrobials:   IV vancomycin 11/04/2018  IV Zosyn started at outside hospital   Subjective: Sleeping.  Easily arousable.  No chest pain.  No shortness of  breath.  Tolerated his diet.  Denies any rectal bleeding.  Objective: Vitals:   11/04/18 2121 11/04/18 2218 11/05/18 0500 11/05/18 1434  BP: (!) 104/55 (!) 108/59 (!) 102/51 121/64  Pulse: (!) 105 99 (!) 103 (!) 110  Resp: (!) 28 20  19   Temp: 98.5 F (36.9 C) 98 F (36.7 C)  (!) 100.4 F (38 C)  TempSrc: Oral   Oral  SpO2: 99% 99%  100%  Weight:      Height:        Intake/Output Summary (Last 24 hours) at 11/05/2018 1601 Last data filed at 11/05/2018 0900 Gross per 24 hour  Intake 3050 ml  Output 353 ml  Net 2697 ml   Filed Weights   11/04/18 0529 11/04/18 1142  Weight: 61.8 kg 61.8 kg    Examination:  General exam: Chronically ill-appearing.  Cachectic.  Respiratory system: Clear to auscultation. Respiratory effort normal. Cardiovascular system: S1 & S2 heard, RRR. No JVD, murmurs, rubs, gallops or clicks. No pedal edema. Gastrointestinal system: Abdomen is nondistended, soft and nontender. No organomegaly or masses felt. Normal bowel sounds heard.  Nephrostomy tubes with good urine output. Central nervous system: Alert and oriented. No focal neurological deficits. Extremities: Symmetric 5 x 5 power. Skin: No rashes, lesions or ulcers Psychiatry: Judgement and insight appear normal. Mood & affect appropriate.     Data Reviewed: I have personally reviewed following labs and imaging studies  CBC: Recent Labs  Lab 11/04/18 0306 11/04/18 0946 11/04/18 1410 11/04/18 1938 11/05/18 0219 11/05/18 0815  WBC 31.4* 32.5* 35.9* 34.4* 32.5* 31.7*  NEUTROABS 26.4*  --   --   --   --  27.5*  HGB 8.4* 8.4* 8.4* 7.6* 8.3* 7.9*  HCT 28.0* 28.0* 28.7* 25.3* 27.8* 26.3*  MCV 85.9 85.6 86.4 85.2 84.8 84.3  PLT 404* 411* 420* 393 414* 765*   Basic Metabolic Panel: Recent Labs  Lab 11/04/18 0307 11/05/18 0219 11/05/18 0815  NA 136  --  137  K 3.9  --  3.8  CL 105  --  106  CO2 23  --  23  GLUCOSE 116*  --  122*  BUN 10  --  11  CREATININE 1.13 1.06 1.03  CALCIUM  11.2*  --  10.8*   GFR: Estimated Creatinine Clearance: 83.3 mL/min (by C-G formula based on SCr of 1.03 mg/dL). Liver Function Tests: Recent Labs  Lab 11/05/18 0815  AST 15  ALT 14  ALKPHOS 79  BILITOT 0.6  PROT 6.5  ALBUMIN 2.1*   No results for input(s): LIPASE, AMYLASE in the last 168 hours. No results for input(s): AMMONIA in the last 168 hours. Coagulation Profile: Recent Labs  Lab 11/04/18 0306 11/04/18 1938  INR 1.20 1.24   Cardiac Enzymes: No results for input(s): CKTOTAL, CKMB, CKMBINDEX, TROPONINI in the last 168 hours. BNP (last 3 results) No results for input(s): PROBNP in the last 8760 hours. HbA1C: No results for input(s): HGBA1C in the last 72 hours. CBG: No results for input(s): GLUCAP in the last 168 hours. Lipid Profile: No results for input(s): CHOL, HDL, LDLCALC, TRIG, CHOLHDL, LDLDIRECT in the last 72 hours. Thyroid Function Tests: No results for input(s): TSH, T4TOTAL, FREET4, T3FREE, THYROIDAB in the last 72 hours. Anemia  Panel: No results for input(s): VITAMINB12, FOLATE, FERRITIN, TIBC, IRON, RETICCTPCT in the last 72 hours. Sepsis Labs: Recent Labs  Lab 11/04/18 1938 11/04/18 2206 11/05/18 0815  PROCALCITON 4.44  --  4.09  LATICACIDVEN 0.7 1.5  --     Recent Results (from the past 240 hour(s))  Culture, blood (Routine X 2) w Reflex to ID Panel     Status: None (Preliminary result)   Collection Time: 11/04/18  3:06 AM  Result Value Ref Range Status   Specimen Description   Final    BLOOD RIGHT HAND Performed at [facility], [place] [address], [place], [place] [address]    Special Requests   Final    BOTTLES DRAWN AEROBIC AND ANAEROBIC Blood Culture adequate volume Performed at [facility] [address] [place], [place] [address]    Culture   Final    NO GROWTH 1 DAY Performed at [facility], [place] [address], [place], [place] [address]    Report Status PENDING  Incomplete  Culture,  blood (Routine X 2) w Reflex to ID Panel     Status: None (Preliminary result)   Collection Time: 11/04/18  3:06 AM  Result Value Ref Range Status   Specimen Description   Final    BLOOD LEFT HAND Performed at [facility] [address], [place], [place] [address]    Special Requests   Final    BOTTLES DRAWN AEROBIC ONLY Blood Culture adequate volume Performed at [facility] [address] [place], [place] [address]    Culture   Final    NO GROWTH 1 DAY Performed at [facility], [place] [address], [place], [place] [address]    Report Status PENDING  Incomplete  Urine Culture     Status: Abnormal   Collection Time: 11/04/18 11:36 AM  Result Value Ref Range Status   Specimen Description   Final    URINE, CLEAN CATCH Performed at [facility], [place] [address] [place], [place] [address]    Special Requests   Final    NONE Performed at [facility], [place] [address] [place], [place] [address]    Culture (A)  Final    <10,000 COLONIES/mL INSIGNIFICANT GROWTH Performed at [facility] [address] [place],  [address]    Report Status 11/05/2018 FINAL  Final         Radiology Studies: Dg Chest 2 View  Result Date: 11/04/2018 CLINICAL DATA:  Leukocytosis EXAM: CHEST - 2 VIEW COMPARISON:  10/30/2018 FINDINGS: Normal heart size, mediastinal contours, and pulmonary vascularity. Lungs clear. No pleural effusion or pneumothorax. Bones unremarkable. IMPRESSION: Normal exam. Electronically Signed   By: Lavonia Dana M.D.   On: 11/04/2018 17:46        Scheduled Meds: . cloZAPine  100 mg Oral q morning - 10a  . cloZAPine  200 mg Oral QHS  . feeding supplement (ENSURE ENLIVE)  237 mL Oral TID BM  . ferrous sulfate  325 mg Oral BID WC  . multivitamin  1 tablet Oral Daily  . nicotine  21 mg Transdermal Daily  . pantoprazole  40 mg Oral BID  . protein supplement  2 oz Oral QID    Continuous Infusions: . sodium chloride 125 mL/hr at 11/05/18 1442  . sodium chloride    . piperacillin-tazobactam (ZOSYN)  IV 3.375 g (11/05/18 1444)  . vancomycin Stopped (11/04/18 2156)  LOS: 1 day    Time spent: 40 mins    Irine Seal, MD [facility] Pager [phone]  If 7PM-7AM, please contact night-coverage [email or web address] Password [email or web address] 11/05/2018, 4:01 PM

## 2018-11-05 NOTE — Consult Note (Signed)
Consultation Note Date: 11/05/2018   Patient Name: Nathan Knox  DOB: [birth year]  MRN: [id number]  Age / Sex: 42 y.o., male  PCP: Patient, No Pcp Per Referring Physician: Eugenie Filler, MD  Reason for Consultation: Establishing goals of care  HPI/Patient Profile: 42 y.o. male  admitted on 11/04/2018    Clinical Assessment and Goals of Care:  42 year old gentleman with history of schizophrenia, stage II chronic kidney disease, advanced urothelial bladder cancer, not currently on treatment, status post bilateral nephrostomy tubes. Patient lives in [place], [place] with his mother. He initially presented to [facility] with back pain and GI bleed. Interventional radiology has been consulted and his left nephrostomy catheter has been exchanged and a new right nephrostomy catheter has also been placed. Additionally, he has also been seen by GI and underwent a colonoscopy that showed narrowing without source of bleeding but is suspected to be related to bladder cancer. Patient remains admitted on hospital medicine service. Next an infectious diseases also following.  Patient's [facility]ologic [facility] been at [facility]. Patient's mother states that his cancer was first diagnosed in 2004. Patient underwent TURBT, chart review also notes:  05/2018 MVAC x2- dose dense However, after 2 cycles, the patient reported intolerance due to toxicity on the basis of generalized fatigue, nausea, dizziness, constipation. Therefore, PAC removed. Reports last cycle was sometime in 06/2018.   Last seen by dr Archie Balboa on 11/20 discussed that he would like palliative treatment and not hospice. possibly immune checkpoint inhibitor such as pembrolizumab is reasonable. He was consented today. Will consider testing for FGFR mutation in the future, if present erdafitinib can be considered.    Patient is resting in bed. I introduced myself and palliative care as follows: Palliative medicine is specialized medical care for people living with serious illness. It focuses on providing relief from the symptoms and stress of a serious illness. The goal is to improve quality of life for both the patient and the family.  Patient's mother Nathan Knox is present at the bedside. She states that she would like to take him home towards the end of this hospitalization. She states that they live in [place], [place]. She states that it is a very long drive for her to bring him here to the hospital.  I asked about the patient's cancer history and current treatment for the cancer. The patient's mother states, "you know there is no acute for cancer. They're trying to do what they can. He might undergo some radiation."  Goals wishes and values attempted to be explored. Attempted to discuss CODE STATUS. Attempted to understand what was important to the patient and his mother as a unit. Patient did not verbalize much at all. Please note additional discussions/recommendations as listed below. Thank you for the consult.  NEXT OF KIN  patient's mother Nathan Knox is his next of kin and decision maker. He is not married. He does not have a spouse. He does not have any children. He has other siblings.  SUMMARY  OF RECOMMENDATIONS    remains full code, discussed with patient and mother present in the room Patient's mother is asking about hospice services on discharge. When I explained in detail about hospice philosophy of care, she does not necessarily agree with that. However, she is asking for help with activities of daily living at home. She states that she has inquired about hospice and is aware that hospice organizations send out aides that can help her with bathing the patient and help in taking care of him at home. She would like to keep him in the home setting for as long as possible.One could consider  addition of hospice as an extra layer of support in the home setting, with a hospice organization working with the patient and his mother to establish DO NOT RESUSCITATE/DO NOT INTUBATE and to establish full scope of comfort measures/true hospice philosophy of care.  Code Status/Advance Care Planning:  Full code    Symptom Management:    as above. Patient has when necessary IV and by mouth pain medications. Continue the same.  Palliative Prophylaxis:   Delirium Protocol   Psycho-social/Spiritual:   Desire for further Chaplaincy support: yes   Additional Recommendations: Education on Hospice  Prognosis:   < 6 months  Discharge Planning: Home with Hospice      Primary Diagnoses: Present on Admission: . Bladder cancer ([place]) . CKD (chronic kidney disease), stage II . Microcytic anemia . Schizophrenia ([place]) . Tobacco abuse . UTI (urinary tract infection) . Sepsis ([place]) . GI bleeding . GIB (gastrointestinal bleeding) . Protein-calorie malnutrition, severe ([place])   I have reviewed the medical record, interviewed the patient and family, and examined the patient. The following aspects are pertinent.  Past Medical History:  Diagnosis Date  . CKD (chronic kidney disease), stage II   . Microcytic anemia   . Schizophrenia ([place])   . Tobacco abuse    Social History   Socioeconomic History  . Marital status: Single    Spouse name: Not on file  . Number of children: Not on file  . Years of education: Not on file  . Highest education level: Not on file  Occupational History  . Not on file  Social Needs  . Financial resource strain: Not on file  . Food insecurity:    Worry: Not on file    Inability: Not on file  . Transportation needs:    Medical: Not on file    Non-medical: Not on file  Tobacco Use  . Smoking status: Current Every Day Smoker  . Smokeless tobacco: Never Used  Substance and Sexual Activity  . Alcohol use: Never    Frequency: Never  . Drug use:  Never  . Sexual activity: Not on file  Lifestyle  . Physical activity:    Days per week: Not on file    Minutes per session: Not on file  . Stress: Not on file  Relationships  . Social connections:    Talks on phone: Not on file    Gets together: Not on file    Attends religious service: Not on file    Active member of club or organization: Not on file    Attends meetings of clubs or organizations: Not on file    Relationship status: Not on file  Other Topics Concern  . Not on file  Social History Narrative  . Not on file   Family History  Problem Relation Age of Onset  . COPD Mother   . Hypertension Father  Scheduled Meds: . cloZAPine  100 mg Oral q morning - 10a  . cloZAPine  200 mg Oral QHS  . feeding supplement (ENSURE ENLIVE)  237 mL Oral TID BM  . ferrous sulfate  325 mg Oral BID WC  . multivitamin  1 tablet Oral Daily  . nicotine  21 mg Transdermal Daily  . pantoprazole  40 mg Oral BID  . protein supplement  2 oz Oral QID   Continuous Infusions: . sodium chloride 125 mL/hr at 11/05/18 1442  . sodium chloride    . piperacillin-tazobactam (ZOSYN)  IV 3.375 g (11/05/18 1444)  . vancomycin Stopped (11/04/18 2156)   PRN Meds:.acetaminophen **OR** acetaminophen, hydrALAZINE, morphine injection, ondansetron **OR** ondansetron (ZOFRAN) IV, oxyCODONE, senna-docusate, zolpidem Medications Prior to Admission:  Prior to Admission medications   Medication Sig Start Date End Date Taking? Authorizing Provider  cloZAPine (CLOZARIL) 100 MG tablet Take 100 mg by mouth at bedtime. 09/23/18  Yes [provider]   Allergies  Allergen Reactions  . Anesthesia [id number]-60     Hyperthermia, coma-like statw   Review of Systems Episodic pain in stomach and back.   Physical Exam Thin cachectic young gentleman resting in bed Patient does not verbalize much S1-S2 Clear breath sounds Patient has bilateral drains Awake alert oriented No edema  Vital Signs: BP 121/64 (BP  Location: Right Arm)   Pulse (!) 110   Temp (!) 100.4 F (38 C) (Oral) Comment: rn notify  Resp 19   Ht 6\' 2"  (1.88 m)   Wt 61.8 kg   SpO2 100%   BMI 17.49 kg/m  Pain Scale: 0-10   Pain Score: 4    SpO2: SpO2: 100 % O2 Device:SpO2: 100 % O2 Flow Rate: .O2 Flow Rate (L/min): 2 L/min  IO: Intake/output summary:   Intake/Output Summary (Last 24 hours) at 11/05/2018 1559 Last data filed at 11/05/2018 0900 Gross per 24 hour  Intake 3050 ml  Output 353 ml  Net 2697 ml    LBM: Last BM Date: 11/03/18 Baseline Weight: Weight: 61.8 kg Most recent weight: Weight: 61.8 kg     Palliative Assessment/Data:   Flowsheet Rows     Most Recent Value  Intake Tab  Referral Department  Hospitalist  Unit at Time of Referral  Med/Surg Unit  Palliative Care Primary Diagnosis  Cancer  Date Notified  11/04/18  Palliative Care Type  New Palliative care  Reason for referral  Clarify Goals of Care  Date of Admission  11/04/18  # of days IP prior to Palliative referral  0  Clinical Assessment  Psychosocial & Spiritual Assessment  Palliative Care Outcomes     PPS 30%  Time In:  1500 Time Out:  1600 Time Total:  60 min  Greater than 50%  of this time was spent counseling and coordinating care related to the above assessment and plan.  Signed by: Loistine Chance, MD  [phone] Please contact Palliative Medicine Team phone at [phone] for questions and concerns.  For individual provider: See Shea Evans

## 2018-11-06 LAB — CBC WITH DIFFERENTIAL/PLATELET
Abs Immature Granulocytes: 0.52 10*3/uL — ABNORMAL HIGH (ref 0.00–0.07)
Basophils Absolute: 0.1 10*3/uL (ref 0.0–0.1)
Basophils Relative: 0 %
Eosinophils Absolute: 0.1 10*3/uL (ref 0.0–0.5)
Eosinophils Relative: 0 %
HEMATOCRIT: 24.8 % — AB (ref 39.0–52.0)
Hemoglobin: 7.2 g/dL — ABNORMAL LOW (ref 13.0–17.0)
Immature Granulocytes: 2 %
Lymphocytes Relative: 8 %
Lymphs Abs: 2.3 10*3/uL (ref 0.7–4.0)
MCH: 25.8 pg — ABNORMAL LOW (ref 26.0–34.0)
MCHC: 29 g/dL — ABNORMAL LOW (ref 30.0–36.0)
MCV: 88.9 fL (ref 80.0–100.0)
MONOS PCT: 6 %
Monocytes Absolute: 1.9 10*3/uL — ABNORMAL HIGH (ref 0.1–1.0)
Neutro Abs: 25.3 10*3/uL — ABNORMAL HIGH (ref 1.7–7.7)
Neutrophils Relative %: 84 %
Platelets: 372 10*3/uL (ref 150–400)
RBC: 2.79 MIL/uL — ABNORMAL LOW (ref 4.22–5.81)
RDW: 19 % — ABNORMAL HIGH (ref 11.5–15.5)
WBC: 30.1 10*3/uL — ABNORMAL HIGH (ref 4.0–10.5)
nRBC: 0 % (ref 0.0–0.2)

## 2018-11-06 LAB — BASIC METABOLIC PANEL
Anion gap: 8 (ref 5–15)
BUN: 9 mg/dL (ref 6–20)
CHLORIDE: 107 mmol/L (ref 98–111)
CO2: 22 mmol/L (ref 22–32)
Calcium: 10.1 mg/dL (ref 8.9–10.3)
Creatinine, Ser: 1.11 mg/dL (ref 0.61–1.24)
GFR calc Af Amer: >60 mL/min (ref >60)
GFR calc non Af Amer: >60 mL/min (ref >60)
GLUCOSE: 123 mg/dL — AB (ref 70–99)
Potassium: 3.6 mmol/L (ref 3.5–5.1)
Sodium: 137 mmol/L (ref 135–145)

## 2018-11-06 LAB — PROCALCITONIN: Procalcitonin: <0.1 ng/mL

## 2018-11-06 MED ORDER — CLOZAPINE 100 MG PO TABS
100.0000 mg | ORAL_TABLET | Freq: Every day | ORAL | Status: DC
  Filled 2018-11-06 (×2): qty 1

## 2018-11-06 MED ORDER — CLOZAPINE 100 MG PO TABS: 200.0000 mg | ORAL_TABLET | Freq: Every day | ORAL | Status: DC

## 2018-11-06 NOTE — Care Management Note (Signed)
Case Management Note  Patient Details  Name: Kharon Hixon MRN: [id number] Date of Birth: 03-Feb-1976  Subjective/Objective:   GI bleed, bladder cancer                 Action/Plan: NCM spoke to pt and mother at bedside. Offered choice for Home Hospice/CMS list provided/placed on chart. Mother is requesting [facility] Faxed referral to Hospice with mother as contact to arrange Home Hospice and DME. Will have NCM follow up on 11/07/2018 on referral.   Expected Discharge Date:                  Expected Discharge Plan:  [place]  In-House Referral:  NA  Discharge planning Services  CM Consult  Post Acute Care Choice:  Hospice Choice offered to:  Parent  DME Arranged:  Other see comment DME Agency:  [place] Arranged:  RN [facility] Agency:  [facility]  Status of Service:  In process, will continue to follow  If discussed at Long Length of Stay Meetings, dates discussed:    Additional Comments:  Erenest Rasher, RN 11/06/2018, 5:22 PM

## 2018-11-06 NOTE — Progress Notes (Signed)
PROGRESS NOTE    Nathan Knox  EVO:[id number] DOB: Apr 13, 1976 DOA: 11/04/2018 PCP: Patient, No Pcp Per    Brief Narrative:  HPI per Dr Derry Lory is a 42 y.o. male with medical history significant of advanced bladder urothelial cancer (elevated to stop treatment few months ago), s/p of bilateral nephrostomy tube placement, schizophrenia, CKD-2, who is transfused from [facility] to [facility] due to GIB.  Pt has hx of bladder cancer, status post bladder cancer removal, and s/p of bilateral nephrostomy tubes placed 6 weeks ago at [facility]. He elected to stop treatment for several months ago. Pt was initially admitted to [facility] due to GI bleeding and back pain on 10/26/2018.  Pt states that his right nephrostomy tube stop having any output several weeks ago. He developed severe, constant and sharp back pain. IR was consulted and exchanged his L nephrostomy catheter and placed a new R nephrostomy catheter. They did a left nephrostogram on 12/6 due to concern for lack of output, but it was noted to be in good position. Repeat CT abdomen pelvis shows new right-sided percutaneous nephrostomy, stable large urinary bladder carcinoma, stable left renal mass and lymphadenopathy.   He was found to have sepsis due to UTI. He's been initially treated with ceftriaxone for presumed UTI. Urine cx returned negative. Urology was c/s, but nothing to offer from surgical standpoint. He had recurrent fever on 12/8. He was recultured and abx broadened to zosyn. His fever has resolved. Today his body temperature is 98.7.  He had leukocytosis with WBC 24, which has been persistent. This morning WBC was 30, which was thought to be partially due to cancer.  Pt also has rectal bleeding with bright red blood.  His hemoglobin dropped from 7.7 on 09/23/18 to 6.0. MCV of 73. Normal lactic acid level. Patient was transfused with blood, hemoglobin back to 8.2 this morning. Gastroenterology was  consulted. Gastroenterology was planning a colonoscopy, however on the day of procedure patient refused. He later changed his mind, however gastroenterology no longer has coverage in the hospital for the next 3 weeks. Patient is transferred to [facility].  Dr. Watt Climes of GI was consulted.   Radiology Studies: CT scan 12/5 1. There is a masslike rounded region of decreased attenuation in the left kidney measuring 5.1 cm. There is adjacent fat stranding. Whether this masslike region represents tumor or a phlegmon/infection cannot be determined with confidence. Focal pyelonephritis with phlegmon formation is certainly possible. 2. The patient has bilateral nephrostomy tubes. Both tubes are near the periphery of the kidneys. I suspect the right tube has been partially pulled back and there is mild right-sided hydronephrosis suggesting the right nephrostomy tube may not be working correctly. The degree of hydronephrosis on the left however is much less in the interval suggesting the left nephrostomy tube is working.  3. The patient's known large bladder tumor is significantly larger in the interval. There appears to be tumor extending nearly the entire length of the left ureter. No excretion from the left kidney on delayed imaging. 4. Worsening adenopathy in the retroperitoneum. 5. A small rounded region of low attenuation in the liver is nonspecific. A small metastasis is not excluded. Recommend attention on follow-up.  Repeat CT abdomen pelvis shows:  New right percutaneous nephrostomy tube in appropriate position, with resolution of right hydronephrosis since previous study. Stable large mass within the urinary bladder, consistent with known bladder carcinoma.   Stable left renal mass and enhancing tumor involving the renal collecting  system and throughout the left ureter, consistent with urothelial carcinoma.  Stable left paraaortic and mild bilateral iliac lymphadenopathy, consistent with metastatic  disease.  No new or progressive metastatic disease identified.  Assessment & Plan:   Principal Problem:   GI bleeding Active Problems:   Sepsis ([place])   Bladder cancer ([place])   CKD (chronic kidney disease), stage II   Microcytic anemia   Schizophrenia (HCC)   Tobacco abuse   UTI (urinary tract infection)   GIB (gastrointestinal bleeding)   Protein-calorie malnutrition, severe (HCC)   Palliative care by specialist   Goals of care, counseling/discussion   Leukocytosis  1 GI bleed/microcytic anemia/anemia of chronic disease/rectal bleeding Patient initially hospitalized at outside hospital for rectal bleeding.  Hemoglobin was 7.7 on 09/23/2018 which dropped down to 6.0 on 10/26/2018.  Patient transfused 2 units packed red blood cells at outside hospital hemoglobin on presentation was 8.2.  Patient noted to have borderline blood pressure.  GI consulted patient placed on IV fluids as well as PPI.  Patient underwent colonoscopy which showed sigmoid colon narrowing seen without any source of bleeding, internal and external hemorrhoids noted.  Patient with no overt bleeding.  Hemoglobin currently at 7.2 from 7.9.  Likely partly dilutional effect.  No overt bleeding.  Transfusion threshold hemoglobin less than 7. Patient seen by GI and no further work-up recommended at this time.  Follow H&H.  2.  Sepsis Questionable etiology.  Patient noted to have sepsis at outside hospital initially felt secondary to UTI.  Patient was initially on IV Rocephin which was subsequently discontinued however patient became febrile and placed on broad-spectrum antibiotics of IV Zosyn at outside hospital prior to transfer.  Patient with a significant leukocytosis with WBC as high as 35.9 during this hospitalization.  White count this morning is 30.1.  Patient noted to have a fever of 101.4 the evening of 11/04/2018.  T-max was 100.6.  Blood cultures pending.  Urinalysis with turbid, moderate leukocytes, nitrite negative,  greater than 50 WBCs.  Urine cultures negative.  Patient placed on IV fluids.  IV antibiotic coverage was broadened and patient started on vancomycin in addition to Zosyn.  Due to patient's history of bladder cancer status post nephrostomy tubes was on chemotherapy and refusing any further chemotherapy over the past few months, ID consulted.  If blood cultures remain negative tomorrow we will discontinue IV vancomycin.  Per ID we will treat for total of 14 days (antibiotic day 10/14).  Per ID can switch to orals if patient remains afebrile.  ID following and appreciate input and recommendations.  3.  Fever/leukocytosis Questionable etiology.  Concerns for sepsis.  May be secondary to malignancy.  Patient has been pancultured.  Patient empirically on IV vancomycin and Zosyn during this hospitalization.  Prior to this hospitalization patient had about 12 days of IV antibiotics with no source noted.  Concerned that patient's fever and leukocytosis may be secondary to malignancy.  Urine cultures negative.  Blood cultures with no growth to date.  Chest x-ray negative for any infiltrate.  If cultures remain negative tomorrow we will discontinue IV vancomycin.  ID consulted and following.   4.  Severe protein calorie malnutrition Secondary to malignancy.  Continue ensure supplementation.  5.  History of schizophrenia Stable.  Continue clozapine.  6.  Stage IV bladder carcinoma Status post bladder cancer removal.  Status post exchange of left nephrostomy catheter and placement of right sided new nephrostomy on 10/27/2018.  CT abdomen and pelvis done at outside hospital  shows bladder cancer stable but advanced with lymphadenopathy.  Patient has refused chemotherapy and immunotherapy.  Concern of noncompliance.  Patient does not want any further treatment at this time when questioned.  Palliative care consulted and patient will likely be discharged home with hospice.  Patient wanting to be a full code.  Continue  symptom management.  Will need outpatient follow-up with his urologist/oncologist.   7.  Chronic kidney disease stage II Stable.  Patient with good urine output.  Follow.  8.  Tobacco abuse Tobacco cessation.  Continue nicotine patch.   DVT prophylaxis: SCDs Code Status: Full Family Communication: Updated patient and mother at bedside. Disposition Plan: To be determined.  Likely home when medically stable, afebrile x24 to 48 hours with clinical improvement and per recommendations from ID.  Likely home with hospice..   Consultants:   Gastroenterology: Dr. Watt Climes 11/04/2018   ID: Dr. Baxter Flattery 11/05/2018  Palliative care: Dr Rowe Pavy 11/05/2018  Procedures:   Chest x-ray 11/04/2018  Colonoscopy 11/04/2018 per Dr. Watt Climes  Antimicrobials:   IV vancomycin 11/04/2018  IV Zosyn started at outside hospital   Subjective: Patient in bed.  Complaining of back pain.  Denies any chest pain.  No shortness of breath.  Mother hoping patient will be discharged today and frustrated at patient's overall situation.  Denies any rectal bleeding.  Tolerating diet.  Patient noted to have a low-grade temp overnight of 100.6.  Objective: Vitals:   11/05/18 1434 11/05/18 1839 11/05/18 2038 11/06/18 0553  BP: 121/64  114/62 100/61  Pulse: (!) 110  (!) 112 (!) 110  Resp: 19  18 16   Temp: (!) 100.4 F (38 C) (!) 100.6 F (38.1 C) 99.8 F (37.7 C) 99.3 F (37.4 C)  TempSrc: Oral Oral Oral Oral  SpO2: 100%  99% 100%  Weight:      Height:        Intake/Output Summary (Last 24 hours) at 11/06/2018 1346 Last data filed at 11/06/2018 0936 Gross per 24 hour  Intake 3206.3 ml  Output 2051 ml  Net 1155.3 ml   Filed Weights   11/04/18 0529 11/04/18 1142  Weight: 61.8 kg 61.8 kg    Examination:  General exam: Chronically ill-appearing.  Cachectic.  Respiratory system: Lungs clear to auscultation bilaterally.  No wheezes, no crackles, no rhonchi.  Cardiovascular system: Regular rate rhythm no  murmurs rubs or gallops.  No JVD, no murmurs, no, no gallops.  No lower extremity edema.  Gastrointestinal system: Abdomen is soft, nontender, nondistended, positive bowel sounds.  No rebound.  No guarding.  Nephrostomy tubes with good urine output. Central nervous system: Alert and oriented. No focal neurological deficits. Extremities: Symmetric 5 x 5 power. Skin: No rashes, lesions or ulcers Psychiatry: Judgement and insight appear normal. Mood & affect appropriate.     Data Reviewed: I have personally reviewed following labs and imaging studies  CBC: Recent Labs  Lab 11/04/18 0306  11/04/18 1410 11/04/18 1938 11/05/18 0219 11/05/18 0815 11/06/18 0649  WBC 31.4*   < > 35.9* 34.4* 32.5* 31.7* 30.1*  NEUTROABS 26.4*  --   --   --   --  27.5* 25.3*  HGB 8.4*   < > 8.4* 7.6* 8.3* 7.9* 7.2*  HCT 28.0*   < > 28.7* 25.3* 27.8* 26.3* 24.8*  MCV 85.9   < > 86.4 85.2 84.8 84.3 88.9  PLT 404*   < > 420* 393 414* 440* 372   < > = values in this interval not displayed.  Basic Metabolic Panel: Recent Labs  Lab 11/04/18 0307 11/05/18 0219 11/05/18 0815 11/06/18 0649  NA 136  --  137 137  K 3.9  --  3.8 3.6  CL 105  --  106 107  CO2 23  --  23 22  GLUCOSE 116*  --  122* 123*  BUN 10  --  11 9  CREATININE 1.13 1.06 1.03 1.11  CALCIUM 11.2*  --  10.8* 10.1   GFR: Estimated Creatinine Clearance: 77.3 mL/min (by C-G formula based on SCr of 1.11 mg/dL). Liver Function Tests: Recent Labs  Lab 11/05/18 0815  AST 15  ALT 14  ALKPHOS 79  BILITOT 0.6  PROT 6.5  ALBUMIN 2.1*   No results for input(s): LIPASE, AMYLASE in the last 168 hours. No results for input(s): AMMONIA in the last 168 hours. Coagulation Profile: Recent Labs  Lab 11/04/18 0306 11/04/18 1938  INR 1.20 1.24   Cardiac Enzymes: No results for input(s): CKTOTAL, CKMB, CKMBINDEX, TROPONINI in the last 168 hours. BNP (last 3 results) No results for input(s): PROBNP in the last 8760 hours. HbA1C: No results  for input(s): HGBA1C in the last 72 hours. CBG: No results for input(s): GLUCAP in the last 168 hours. Lipid Profile: No results for input(s): CHOL, HDL, LDLCALC, TRIG, CHOLHDL, LDLDIRECT in the last 72 hours. Thyroid Function Tests: No results for input(s): TSH, T4TOTAL, FREET4, T3FREE, THYROIDAB in the last 72 hours. Anemia Panel: No results for input(s): VITAMINB12, FOLATE, FERRITIN, TIBC, IRON, RETICCTPCT in the last 72 hours. Sepsis Labs: Recent Labs  Lab 11/04/18 1938 11/04/18 2206 11/05/18 0815 11/06/18 0649  PROCALCITON 4.44  --  4.09 <0.10  LATICACIDVEN 0.7 1.5  --   --     Recent Results (from the past 240 hour(s))  Culture, blood (Routine X 2) w Reflex to ID Panel     Status: None (Preliminary result)   Collection Time: 11/04/18  3:06 AM  Result Value Ref Range Status   Specimen Description   Final    BLOOD RIGHT HAND Performed at [facility], [place] [address], [place], [place] [address]    Special Requests   Final    BOTTLES DRAWN AEROBIC AND ANAEROBIC Blood Culture adequate volume Performed at [facility] [address], [place], [place] [address]    Culture   Final    NO GROWTH 1 DAY Performed at [facility], [place] [address], [place], [place] [address]    Report Status PENDING  Incomplete  Culture, blood (Routine X 2) w Reflex to ID Panel     Status: None (Preliminary result)   Collection Time: 11/04/18  3:06 AM  Result Value Ref Range Status   Specimen Description   Final    BLOOD LEFT HAND Performed at [facility] [address] [place], [place] [address]    Special Requests   Final    BOTTLES DRAWN AEROBIC ONLY Blood Culture adequate volume Performed at [facility] [address] [place],  [address]    Culture   Final    NO GROWTH 1 DAY Performed at [facility], [place] [address] [place],  [address]    Report Status PENDING  Incomplete    Urine Culture     Status: Abnormal   Collection Time: 11/04/18 11:36 AM  Result Value Ref Range Status   Specimen Description   Final    URINE, CLEAN CATCH Performed at [facility], [place] [address] [place], [place] [address]    Special Requests   Final    NONE Performed at [facility], [place] [address] [place], [place] [address]    Culture (A)  Final    <10,000 COLONIES/mL INSIGNIFICANT GROWTH Performed at [facility] [address], [place], [place] [address]    Report Status 11/05/2018 FINAL  Final         Radiology Studies: Dg Chest 2 View  Result Date: 11/04/2018 CLINICAL DATA:  Leukocytosis EXAM: CHEST - 2 VIEW COMPARISON:  10/30/2018 FINDINGS: Normal heart size, mediastinal contours, and pulmonary vascularity. Lungs clear. No pleural effusion or pneumothorax. Bones unremarkable. IMPRESSION: Normal exam. Electronically Signed   By: Lavonia Dana M.D.   On: 11/04/2018 17:46        Scheduled Meds: . cloZAPine  100 mg Oral q morning - 10a  . cloZAPine  200 mg Oral QHS  . feeding supplement (ENSURE ENLIVE)  237 mL Oral TID BM  . ferrous sulfate  325 mg Oral BID WC  . multivitamin  1 tablet Oral Daily  . nicotine  21 mg Transdermal Daily  . pantoprazole  40 mg Oral BID  . protein supplement  2 oz Oral QID   Continuous Infusions: . sodium chloride 125 mL/hr at 11/06/18 0535  . sodium chloride    . piperacillin-tazobactam (ZOSYN)  IV Stopped (11/06/18 0936)     LOS: 2 days    Time spent: 85 mins    Irine Seal, MD [facility] Pager [phone]  If 7PM-7AM, please contact night-coverage [email or web address] Password [email or web address] 11/06/2018, 1:46 PM

## 2018-11-06 NOTE — Progress Notes (Signed)
PMT progress note  Patient is resting in bed, his mother Nathan Knox is at bedside Patient seen along with [facility] MD Dr. Grandville Silos.   Pain medication use: PO PRN Oxy IR 5 mg each, 3 doses in past 24 hours, in addition to IV Morphine PRN 2 mg, 2 doses in the past 24 hours.   Still with some uncontrolled pain.  Flat affect Appears with regular work of breathing S 1 S 2  Is thin and has muscle wasting Awake alert Non focal  PPS 40%  BP 100/61 (BP Location: Right Arm)   Pulse (!) 110   Temp 99.3 F (37.4 C) (Oral)   Resp 16   Ht 6\' 2"  (1.88 m)   Wt 61.8 kg   SpO2 100%   BMI 17.49 kg/m  Labs and imaging noted  Advanced urothelial bladder cancer, patient's mother states that the patient would not like to go to [facility]. She would like to keep him home and is accepting of hospice services, though not accepting of hospice philosophy of care, patient and mother desire full code. Patient's mother states that she is very frustrated, she wants the patient home. We have discussed with her about the patient's current condition, his recent GIB, his possible urinary source of infection.   Care manager consult requested to help facilitate, patient needs hospital bed, may also need other DME. Remains full code. Anticipate D.C home with hospice soon.   Nathan Knox's number is [phone].   25 minutes spent.  Nathan Chance MD [place] palliative medicine team [phone]

## 2018-11-06 NOTE — Progress Notes (Signed)
ID PROGRESS NOTE  42yo M with advance bladder cancer - with worsening progression presenting with low back pain, fevers, GIB.  Suspect leukocytosis is due to malignancy and inflammation. Last oncology note recommended palliative chemo but unclear if patient wants to pursue those measures  -still having fever, leukocytosis unchanged. No new source of infection despite being on abtx now day #10 - will d/c vancomycin since no source of mrsa - can continue with piptazo for addn days while hospitalized. Would aim 14d course.  - if patient ready to discharge home (hospice) can switch to oral regimen of amox/clav to finish out course  Will sign off. Call if questions.  Nathan Knox [facility] [phone]

## 2018-11-07 DIAGNOSIS — R52 Pain, unspecified: Secondary | ICD-10-CM

## 2018-11-07 LAB — BASIC METABOLIC PANEL
Anion gap: 9 (ref 5–15)
BUN: 12 mg/dL (ref 6–20)
CO2: 21 mmol/L — ABNORMAL LOW (ref 22–32)
Calcium: 10.1 mg/dL (ref 8.9–10.3)
Chloride: 107 mmol/L (ref 98–111)
Creatinine, Ser: 1.08 mg/dL (ref 0.61–1.24)
GFR calc Af Amer: >60 mL/min (ref >60)
GFR calc non Af Amer: >60 mL/min (ref >60)
Glucose, Bld: 120 mg/dL — ABNORMAL HIGH (ref 70–99)
POTASSIUM: 3.8 mmol/L (ref 3.5–5.1)
Sodium: 137 mmol/L (ref 135–145)

## 2018-11-07 LAB — CBC WITH DIFFERENTIAL/PLATELET
ABS IMMATURE GRANULOCYTES: 0.51 10*3/uL — AB (ref 0.00–0.07)
Basophils Absolute: 0.1 10*3/uL (ref 0.0–0.1)
Basophils Relative: 0 %
Eosinophils Absolute: 0.1 10*3/uL (ref 0.0–0.5)
Eosinophils Relative: 0 %
HCT: 23.7 % — ABNORMAL LOW (ref 39.0–52.0)
Hemoglobin: 7 g/dL — ABNORMAL LOW (ref 13.0–17.0)
Immature Granulocytes: 2 %
LYMPHS PCT: 8 %
Lymphs Abs: 2.4 10*3/uL (ref 0.7–4.0)
MCH: 25.5 pg — AB (ref 26.0–34.0)
MCHC: 29.5 g/dL — ABNORMAL LOW (ref 30.0–36.0)
MCV: 86.5 fL (ref 80.0–100.0)
Monocytes Absolute: 1.8 10*3/uL — ABNORMAL HIGH (ref 0.1–1.0)
Monocytes Relative: 6 %
Neutro Abs: 25.1 10*3/uL — ABNORMAL HIGH (ref 1.7–7.7)
Neutrophils Relative %: 84 %
Platelets: 399 10*3/uL (ref 150–400)
RBC: 2.74 MIL/uL — AB (ref 4.22–5.81)
RDW: 19.1 % — ABNORMAL HIGH (ref 11.5–15.5)
WBC: 29.9 10*3/uL — AB (ref 4.0–10.5)
nRBC: 0 % (ref 0.0–0.2)

## 2018-11-07 LAB — PROCALCITONIN: Procalcitonin: 4.49 ng/mL

## 2018-11-07 LAB — PREPARE RBC (CROSSMATCH)

## 2018-11-07 MED ORDER — OXYCODONE HCL 5 MG PO TABS: 5.0000 mg | ORAL_TABLET | ORAL | 0 refills | Status: DC | PRN

## 2018-11-07 MED ORDER — ENSURE ENLIVE PO LIQD: 237.0000 mL | Freq: Three times a day (TID) | ORAL | 12 refills | Status: AC

## 2018-11-07 MED ORDER — SODIUM CHLORIDE 0.9% IV SOLUTION: Freq: Once | INTRAVENOUS | Status: DC

## 2018-11-07 MED ORDER — FUROSEMIDE 10 MG/ML IJ SOLN
20.0000 mg | Freq: Once | INTRAMUSCULAR | Status: AC
  Administered 2018-11-07: 20 mg via INTRAVENOUS
  Filled 2018-11-07: qty 2

## 2018-11-07 MED ORDER — DIPHENHYDRAMINE HCL 25 MG PO CAPS
25.0000 mg | ORAL_CAPSULE | Freq: Once | ORAL | Status: AC
  Administered 2018-11-07: 25 mg via ORAL
  Filled 2018-11-07: qty 1

## 2018-11-07 MED ORDER — NICOTINE 21 MG/24HR TD PT24: 21.0000 mg | MEDICATED_PATCH | Freq: Every day | TRANSDERMAL | 0 refills | Status: DC

## 2018-11-07 MED ORDER — PANTOPRAZOLE SODIUM 40 MG PO TBEC: 40.0000 mg | DELAYED_RELEASE_TABLET | Freq: Two times a day (BID) | ORAL | 1 refills | Status: AC

## 2018-11-07 MED ORDER — UNJURY CHICKEN SOUP POWDER: 2.0000 [oz_av] | Freq: Four times a day (QID) | ORAL | Status: DC

## 2018-11-07 MED ORDER — AMOXICILLIN-POT CLAVULANATE 875-125 MG PO TABS: 1.0000 | ORAL_TABLET | Freq: Two times a day (BID) | ORAL | 0 refills | Status: AC

## 2018-11-07 MED ORDER — ACETAMINOPHEN 325 MG PO TABS
650.0000 mg | ORAL_TABLET | Freq: Once | ORAL | Status: AC
  Administered 2018-11-07: 650 mg via ORAL
  Filled 2018-11-07: qty 2

## 2018-11-07 MED ORDER — HYDROCORTISONE 2.5 % RE CREA: TOPICAL_CREAM | RECTAL | 1 refills | Status: AC

## 2018-11-07 MED ORDER — FERROUS SULFATE 325 (65 FE) MG PO TABS: 325.0000 mg | ORAL_TABLET | Freq: Two times a day (BID) | ORAL | Status: DC

## 2018-11-07 MED ORDER — PROSIGHT PO TABS: 1.0000 | ORAL_TABLET | Freq: Every day | ORAL | 0 refills | Status: DC

## 2018-11-07 MED ORDER — OXYCODONE HCL 5 MG PO TABS: 5.0000 mg | ORAL_TABLET | ORAL | Status: DC | PRN

## 2018-11-07 NOTE — Care Management Important Message (Signed)
Important Message  Patient Details  Name: Nathan Knox MRN: [id number] Date of Birth: 18-May-1976   Medicare Important Message Given:  Yes    Kerin Salen 11/07/2018, 2:11 [place] Message  Patient Details  Name: Nathan Knox MRN: [id number] Date of Birth: 12-Feb-1976   Medicare Important Message Given:  Yes    Kerin Salen 11/07/2018, 2:11 PM

## 2018-11-07 NOTE — Progress Notes (Signed)
VAST RN consulted to start second PIV for pain meds. Upon arrival to bedside, pt's nurse, Andee Poles stated she was going to cancel consult as pt is now being discharged home.

## 2018-11-07 NOTE — Progress Notes (Signed)
A call to [facility] was made to follow up on referral and DME. Rep will call pt's mother.

## 2018-11-07 NOTE — Discharge Summary (Addendum)
Physician Discharge Summary  Nathan Knox RCV:[id number] DOB: 12/25/1975 DOA: 11/04/2018  PCP: Patient, No Pcp Per  Admit date: 11/04/2018 Discharge date: 11/07/2018  Time spent: 55 minutes  Recommendations for Outpatient Follow-up:  1. Patient be discharged home with hospice services. 2. Follow-up with PCP in 1 to 2 weeks. 3. Follow-up with oncology as previously scheduled. 4. Follow-up with urology as previously scheduled.   Discharge Diagnoses:  Principal Problem:   GI bleeding Active Problems:   Sepsis ([place])   Bladder cancer ([place])   CKD (chronic kidney disease), stage II   Microcytic anemia   Schizophrenia ([facility])   Tobacco abuse   UTI (urinary tract infection)   GIB (gastrointestinal bleeding)   Protein-calorie malnutrition, severe ([place])   Palliative care by specialist   Goals of care, counseling/discussion   Leukocytosis   Generalized pain   Discharge Condition: Stable  Diet recommendation: Regular  Filed Weights   11/04/18 0529 11/04/18 1142  Weight: 61.8 kg 61.8 kg    History of present illness:  Per Dr. Derry Lory is a 41 y.o. male with medical history significant of advanced bladder urothelial cancer (elevated to stop treatment few months ago), s/p of bilateral nephrostomy tube placement, schizophrenia, CKD-2, who was transfused from [facility] to [facility] due to GIB.  Pt has hx of bladder cancer, status post bladder cancer removal, and s/p of bilateral nephrostomy tubes placed 6 weeks ago at [facility]. He elected to stop treatment for several months ago. Pt was initially admitted to [facility] due to GI bleeding and back pain on 10/26/2018.  Pt states that his right nephrostomy tube stop having any output several weeks ago. He developed severe, constant and sharp back pain. IR was consulted and exchanged his L nephrostomy catheter and placed a new R nephrostomy catheter. They did a left nephrostogram on 12/6 due to concern for lack  of output, but it was noted to be in good position. Repeat CT abdomen pelvis shows new right-sided percutaneous nephrostomy, stable large urinary bladder carcinoma, stable left renal mass and lymphadenopathy.   He was found to have sepsis due to UTI. He's been initially treated with ceftriaxone for presumed UTI. Urine cx returned negative. Urology was c/s, but nothing to offer from surgical standpoint. He had recurrent fever on 12/8. He was recultured and abx broadened to zosyn. His fever has resolved. Today his body temperature is 98.7.  He had leukocytosis with WBC 24, which has been persistent. This morning WBC was 30, which was thought to be partially due to cancer.  Pt also has rectal bleeding with bright red blood.  His hemoglobin dropped from 7.7 on 09/23/18 to 6.0. MCV of 73. Normal lactic acid level. Patient was transfused with blood, hemoglobin back to 8.2 this morning. Gastroenterology was consulted. Gastroenterology was planning a colonoscopy, however on the day of procedure patient refused. He later changed his mind, however gastroenterology no longer has coverage in the hospital for the next 3 weeks. Patient is transferred to [facility].  Dr. Watt Climes of GI was consulted.   Radiology Studies: CT scan 12/5 1. There is a masslike rounded region of decreased attenuation in the left kidney measuring 5.1 cm. There is adjacent fat stranding. Whether this masslike region represents tumor or a phlegmon/infection cannot be determined with confidence. Focal pyelonephritis with phlegmon formation is certainly possible. 2. The patient has bilateral nephrostomy tubes. Both tubes are near the periphery of the kidneys. I suspect the right tube has been partially pulled back  and there is mild right-sided hydronephrosis suggesting the right nephrostomy tube may not be working correctly. The degree of hydronephrosis on the left however is much less in the interval suggesting the left nephrostomy tube is working.   3. The patient's known large bladder tumor is significantly larger in the interval. There appears to be tumor extending nearly the entire length of the left ureter. No excretion from the left kidney on delayed imaging. 4. Worsening adenopathy in the retroperitoneum. 5. A small rounded region of low attenuation in the liver is nonspecific. A small metastasis is not excluded. Recommend attention on follow-up.  Repeat CT abdomen pelvis shows:  New right percutaneous nephrostomy tube in appropriate position, with resolution of right hydronephrosis since previous study. Stable large mass within the urinary bladder, consistent with known bladder carcinoma.   Stable left renal mass and enhancing tumor involving the renal collecting system and throughout the left ureter, consistent with urothelial carcinoma.  Stable left paraaortic and mild bilateral iliac lymphadenopathy, consistent with metastatic disease.  No new or progressive metastatic disease identified.   Hospital Course:  1 GI bleed/microcytic anemia/anemia of chronic disease/rectal bleeding Patient initially hospitalized at outside hospital for rectal bleeding.  Hemoglobin was 7.7 on 09/23/2018 which dropped down to 6.0 on 10/26/2018.  Patient transfused 2 units packed red blood cells at outside hospital hemoglobin on presentation was 8.2.  Patient noted to have borderline blood pressure.  GI consulted patient placed on IV fluids as well as PPI.  Patient underwent colonoscopy which showed sigmoid colon narrowing seen without any source of bleeding, internal and external hemorrhoids noted.  Patient with no overt bleeding at his hospitalization.  GI followed patient during the hospitalization and recommended no further work-up at this time.  Patient noted to have a hemoglobin of 7.0 on day of discharge.  Patient transfused 2 units packed red blood cells prior to discharge.  Patient be discharged home with hospice.  Outpatient follow-up with  PCP.  2.  Sepsis Questionable etiology.  Patient noted to have sepsis at outside hospital initially felt secondary to UTI.  Patient was initially on IV Rocephin which was subsequently discontinued however patient became febrile and placed on broad-spectrum antibiotics of IV Zosyn at outside hospital prior to transfer.  Patient with a significant leukocytosis with WBC as high as 35.9 during this hospitalization.  White count during the hospitalization noted to be elevated.  Patient noted to have a fever of 101.4 the evening of 11/04/2018.  T-max was 100.6.  Blood cultures pending with no growth today.  Urinalysis with turbid, moderate leukocytes, nitrite negative, greater than 50 WBCs.  Urine cultures negative.  Patient placed on IV fluids.  IV antibiotic coverage was broadened and patient started on vancomycin in addition to Zosyn.  Due to patient's history of bladder cancer status post nephrostomy tubes was on chemotherapy and refusing any further chemotherapy over the past few months, ID consulted.  It was felt by ID the patient's fever and leukocytosis may be likely secondary to malignancy.  It was felt per ID however to continue empiric IV Zosyn to complete a 14-day course of antibiotic treatment.  ID recommended on discharge to transition to oral Augmentin to complete course.  IV vancomycin was subsequently discontinued.  Patient will be discharged home on 4 more days of oral Augmentin to complete a 14-day course of antibiotic treatment.  Outpatient follow-up with PCP.  Patient will be discharged home with hospice.   3.  Fever/leukocytosis Questionable etiology.  Concerns for  sepsis.    Felt likely secondary to malignancy.  Patient has been pancultured no growth to date.  Patient empirically on IV vancomycin and Zosyn during this hospitalization due to high fevers noted.  Prior to this hospitalization patient had about 12 days of IV antibiotics with no source noted.  Concerned that patient's fever and  leukocytosis may be secondary to malignancy.  Urine cultures negative.  Blood cultures with no growth to date.  Chest x-ray negative for any infiltrate.  IV Vanco subsequently discontinued.  ID was consulted and felt patient's leukocytosis may be secondary to leg weakness and inflammation.  Patient noted to be having fevers.  ID recommended continuation of IV Zosyn while hospitalized with a goal of 14 days of antibiotic treatment and to transition to oral Augmentin when patient is discharged home to finish out the course.  Patient be discharged home on 4 more days of oral Augmentin to complete a 14-day course of antibiotic treatment.  Outpatient follow-up with urology/oncology.  4.  Severe protein calorie malnutrition Secondary to malignancy.    Patient placed on ensure supplementation.  5.  History of schizophrenia Patient maintained on home regimen of clozapine.    6.  Stage IV bladder carcinoma Status post bladder cancer removal.  Status post exchange of left nephrostomy catheter and placement of right sided new nephrostomy on 10/27/2018.  CT abdomen and pelvis done at outside hospital shows bladder cancer stable but advanced with lymphadenopathy.  Patient has refused chemotherapy and immunotherapy.  Concern of noncompliance.  Patient does not want any further treatment at this time when questioned.  Palliative care consulted and patient will likely be discharged home with hospice.  Patient wanting to be a full code.  Will need outpatient follow-up with his urologist/oncologist.   7.  Chronic kidney disease stage II Stable.  Patient with good urine output.    8.  Tobacco abuse Tobacco cessation.  Patient maintained on nicotine patch.   Procedures:  Chest x-ray 11/04/2018  Colonoscopy 11/04/2018 per Dr. Watt Climes  2 units PRBCs 11/07/2018   Consultations:  Gastroenterology: Dr. Watt Climes 11/04/2018   ID: Dr. Baxter Flattery 11/05/2018  Palliative care: Dr Rowe Pavy 11/05/2018   Discharge  Exam: Vitals:   11/07/18 1423 11/07/18 1652  BP: 116/64 112/66  Pulse: (!) 106 (!) 107  Resp: 16 18  Temp: 99.7 F (37.6 C) 98.4 F (36.9 C)  SpO2: 99% 98%    General: NAD Cardiovascular: RRR Respiratory: CTAB  Discharge Instructions   Discharge Instructions    Diet general   Complete by:  As directed    Increase activity slowly   Complete by:  As directed      Allergies as of 11/07/2018      Reactions   Anesthesia S-i-60    Hyperthermia, coma-like statw      Medication List    TAKE these medications   amoxicillin-clavulanate 875-125 MG tablet Commonly known as:  AUGMENTIN Take 1 tablet by mouth 2 (two) times daily for 4 days.   cloZAPine 100 MG tablet Commonly known as:  CLOZARIL Take 100 mg by mouth at bedtime.   feeding supplement (ENSURE ENLIVE) Liqd Take 237 mLs by mouth 3 (three) times daily between meals.   ferrous sulfate 325 (65 FE) MG tablet Take 1 tablet (325 mg total) by mouth 2 (two) times daily with a meal.   hydrocortisone 2.5 % rectal cream Commonly known as:  ANUSOL-HC Apply rectally 2 times daily   multivitamin Tabs tablet Take 1 tablet by mouth  daily. Start taking on:  November 08, 2018   nicotine 21 mg/24hr patch Commonly known as:  NICODERM CQ - dosed in mg/24 hours Place 1 patch (21 mg total) onto the skin daily. Start taking on:  November 08, 2018   oxyCODONE 5 MG immediate release tablet Commonly known as:  Oxy IR/ROXICODONE Take 1 tablet (5 mg total) by mouth every 3 (three) hours as needed for moderate pain.   pantoprazole 40 MG tablet Commonly known as:  PROTONIX Take 1 tablet (40 mg total) by mouth 2 (two) times daily.   protein supplement Powd Commonly known as:  UNJURY CHICKEN SOUP Take 7 g (2 oz total) by mouth 4 (four) times daily.      Allergies  Allergen Reactions  . Anesthesia S-I-60     Hyperthermia, coma-like statw   [place] Follow up.   Specialty:  Home  Health Services Why:  Home Hospice RN will call to arrange initial visit Contact information: [place] [address] [phone]        Oncology Follow up.   Why:  Follow up as scheduled.       PCP. Schedule an appointment as soon as possible for a visit in 1 week(s).   Why:  f/u in 1-2 weeks.       urology Follow up.            The results of significant diagnostics from this hospitalization (including imaging, microbiology, ancillary and laboratory) are listed below for reference.    Significant Diagnostic Studies: Dg Chest 2 View  Result Date: 11/04/2018 CLINICAL DATA:  Leukocytosis EXAM: CHEST - 2 VIEW COMPARISON:  10/30/2018 FINDINGS: Normal heart size, mediastinal contours, and pulmonary vascularity. Lungs clear. No pleural effusion or pneumothorax. Bones unremarkable. IMPRESSION: Normal exam. Electronically Signed   By: Lavonia Dana M.D.   On: 11/04/2018 17:46    Microbiology: Recent Results (from the past 240 hour(s))  Culture, blood (Routine X 2) w Reflex to ID Panel     Status: None (Preliminary result)   Collection Time: 11/04/18  3:06 AM  Result Value Ref Range Status   Specimen Description   Final    BLOOD RIGHT HAND Performed at [facility] [address], [place], [place] [address]    Special Requests   Final    BOTTLES DRAWN AEROBIC AND ANAEROBIC Blood Culture adequate volume Performed at [facility] [address], [place], [place] [address]    Culture   Final    NO GROWTH 3 DAYS Performed at [facility], [place] [address], [place], [place] [address]    Report Status PENDING  Incomplete  Culture, blood (Routine X 2) w Reflex to ID Panel     Status: None (Preliminary result)   Collection Time: 11/04/18  3:06 AM  Result Value Ref Range Status   Specimen Description   Final    BLOOD LEFT HAND Performed at [facility] [address], [place], [place] [address]     Special Requests   Final    BOTTLES DRAWN AEROBIC ONLY Blood Culture adequate volume Performed at [facility] [address] [place], [place] [address]    Culture   Final    NO GROWTH 3 DAYS Performed at [facility], [place] [address] [place],  [address]    Report Status PENDING  Incomplete  Urine Culture     Status:  Abnormal   Collection Time: 11/04/18 11:36 AM  Result Value Ref Range Status   Specimen Description   Final    URINE, CLEAN CATCH Performed at [facility], [place] [address], [place], [place] [address]    Special Requests   Final    NONE Performed at [facility], [place] [address] [place], [place] [address]    Culture (A)  Final    <10,000 COLONIES/mL INSIGNIFICANT GROWTH Performed at [facility] [address], [place], [place] [address]    Report Status 11/05/2018 FINAL  Final     Labs: Basic Metabolic Panel: Recent Labs  Lab 11/04/18 0307 11/05/18 0219 11/05/18 0815 11/06/18 0649 11/07/18 0554  NA 136  --  137 137 137  K 3.9  --  3.8 3.6 3.8  CL 105  --  106 107 107  CO2 23  --  23 22 21*  GLUCOSE 116*  --  122* 123* 120*  BUN 10  --  11 9 12   CREATININE 1.13 1.06 1.03 1.11 1.08  CALCIUM 11.2*  --  10.8* 10.1 10.1   Liver Function Tests: Recent Labs  Lab 11/05/18 0815  AST 15  ALT 14  ALKPHOS 79  BILITOT 0.6  PROT 6.5  ALBUMIN 2.1*   No results for input(s): LIPASE, AMYLASE in the last 168 hours. No results for input(s): AMMONIA in the last 168 hours. CBC: Recent Labs  Lab 11/04/18 0306  11/04/18 1938 11/05/18 0219 11/05/18 0815 11/06/18 0649 11/07/18 0554  WBC 31.4*   < > 34.4* 32.5* 31.7* 30.1* 29.9*  NEUTROABS 26.4*  --   --   --  27.5* 25.3* 25.1*  HGB 8.4*   < > 7.6* 8.3* 7.9* 7.2* 7.0*  HCT 28.0*   < > 25.3* 27.8* 26.3* 24.8* 23.7*  MCV 85.9   < > 85.2 84.8 84.3 88.9 86.5  PLT 404*   < > 393 414* 440* 372 399   < > = values in this interval not  displayed.   Cardiac Enzymes: No results for input(s): CKTOTAL, CKMB, CKMBINDEX, TROPONINI in the last 168 hours. BNP: BNP (last 3 results) No results for input(s): BNP in the last 8760 hours.  ProBNP (last 3 results) No results for input(s): PROBNP in the last 8760 hours.  CBG: No results for input(s): GLUCAP in the last 168 hours.     Signed:  Irine Seal MD.  [facility] 11/07/2018, 5:36 PM

## 2018-11-07 NOTE — Progress Notes (Signed)
PMT progress note  Patient is resting in bed, his mother Otilio Saber is at bedside Patient seen and I have also discussed with with [facility] MD Dr. [place] Silos.   Pain medication use: PO PRN Oxy IR 5 mg each, 2 doses in past 24 hours, how ever, patient states that he hurts all over and that his pain is uncontrolled  Still with some uncontrolled pain.  Flat affect Appears with regular work of breathing S 1 S 2  Is thin and has muscle wasting Awake alert Non focal  PPS 40%  BP 105/63 (BP Location: Right Arm)   Pulse (!) 110   Temp 98.9 F (37.2 C)   Resp 20   Ht 6\' 2"  (1.88 m)   Wt 61.8 kg   SpO2 100%   BMI 17.49 kg/m  Labs and imaging noted  Advanced urothelial bladder cancer, patient's mother states that the patient would not like to go to [facility]. She would like to keep him home and is accepting of hospice services, though not accepting of hospice philosophy of care, patient and mother desire full code. Patient's mother states that she is very frustrated, she wants the patient home. We have discussed with her about the patient's current condition, his recent GIB, his possible urinary source of infection.   Care manager consult requested to help facilitate, patient needs hospital bed, may also need other DME. Remains full code. Anticipate D.C home with hospice soon.   [name]'s number is [phone].   I will adjust his pain regimen, he is to get PRBC today for low Hgb.   25 minutes spent.  Loistine Chance MD [place] palliative medicine team [phone]

## 2018-11-07 NOTE — Progress Notes (Signed)
[facility] in house rep [place], RN called, pt will be followed at home for hospice care. Pt's mother will transport pt home.

## 2018-11-08 LAB — TYPE AND SCREEN
ABO/RH(D): B POS
Antibody Screen: NEGATIVE
Unit division: 0
Unit division: 0

## 2018-11-08 LAB — BPAM RBC
Blood Product Expiration Date: 202001052359
Blood Product Expiration Date: 202001132359
ISSUE DATE / TIME: 201912160926
ISSUE DATE / TIME: 201912161400
Unit Type and Rh: 7300
Unit Type and Rh: 7300

## 2018-11-09 LAB — CULTURE, BLOOD (ROUTINE X 2)
Culture: NO GROWTH
Culture: NO GROWTH
Special Requests: ADEQUATE
Special Requests: ADEQUATE

## 2018-11-19 ENCOUNTER — Inpatient Hospital Stay (HOSPITAL_COMMUNITY)
Admission: AD | Admit: 2018-11-19 | Discharge: 2018-11-22 | DRG: 844 | Disposition: A | Discharge status: Hospice | Payer: Medicare Other | Source: Other Acute Inpatient Hospital | Attending: Internal Medicine | Admitting: Internal Medicine

## 2018-11-19 DIAGNOSIS — C679 Malignant neoplasm of bladder, unspecified: Secondary | ICD-10-CM | POA: Diagnosis present

## 2018-11-19 DIAGNOSIS — T40605A Adverse effect of unspecified narcotics, initial encounter: Secondary | ICD-10-CM | POA: Diagnosis not present

## 2018-11-19 DIAGNOSIS — Z960 Presence of urogenital implants: Secondary | ICD-10-CM | POA: Diagnosis not present

## 2018-11-19 DIAGNOSIS — N179 Acute kidney failure, unspecified: Secondary | ICD-10-CM | POA: Diagnosis not present

## 2018-11-19 DIAGNOSIS — Z79899 Other long term (current) drug therapy: Secondary | ICD-10-CM

## 2018-11-19 DIAGNOSIS — D72828 Other elevated white blood cell count: Secondary | ICD-10-CM | POA: Diagnosis present

## 2018-11-19 DIAGNOSIS — G8929 Other chronic pain: Secondary | ICD-10-CM | POA: Diagnosis present

## 2018-11-19 DIAGNOSIS — D72829 Elevated white blood cell count, unspecified: Secondary | ICD-10-CM | POA: Diagnosis present

## 2018-11-19 DIAGNOSIS — C799 Secondary malignant neoplasm of unspecified site: Principal | ICD-10-CM | POA: Diagnosis present

## 2018-11-19 DIAGNOSIS — N182 Chronic kidney disease, stage 2 (mild): Secondary | ICD-10-CM | POA: Diagnosis present

## 2018-11-19 DIAGNOSIS — Z9221 Personal history of antineoplastic chemotherapy: Secondary | ICD-10-CM | POA: Diagnosis not present

## 2018-11-19 DIAGNOSIS — F209 Schizophrenia, unspecified: Secondary | ICD-10-CM | POA: Diagnosis present

## 2018-11-19 DIAGNOSIS — F17200 Nicotine dependence, unspecified, uncomplicated: Secondary | ICD-10-CM | POA: Diagnosis not present

## 2018-11-19 DIAGNOSIS — N390 Urinary tract infection, site not specified: Secondary | ICD-10-CM | POA: Diagnosis present

## 2018-11-19 DIAGNOSIS — Z884 Allergy status to anesthetic agent status: Secondary | ICD-10-CM

## 2018-11-19 DIAGNOSIS — K5903 Drug induced constipation: Secondary | ICD-10-CM | POA: Diagnosis present

## 2018-11-19 DIAGNOSIS — R52 Pain, unspecified: Secondary | ICD-10-CM | POA: Diagnosis present

## 2018-11-19 DIAGNOSIS — D509 Iron deficiency anemia, unspecified: Secondary | ICD-10-CM | POA: Diagnosis not present

## 2018-11-19 DIAGNOSIS — Z7189 Other specified counseling: Secondary | ICD-10-CM

## 2018-11-19 DIAGNOSIS — N183 Chronic kidney disease, stage 3 (moderate): Secondary | ICD-10-CM | POA: Diagnosis present

## 2018-11-19 LAB — CBC
HCT: 30.2 % — ABNORMAL LOW (ref 39.0–52.0)
Hemoglobin: 9.1 g/dL — ABNORMAL LOW (ref 13.0–17.0)
MCH: 26.1 pg (ref 26.0–34.0)
MCHC: 30.1 g/dL (ref 30.0–36.0)
MCV: 86.8 fL (ref 80.0–100.0)
PLATELETS: 400 10*3/uL (ref 150–400)
RBC: 3.48 MIL/uL — ABNORMAL LOW (ref 4.22–5.81)
RDW: 18.6 % — ABNORMAL HIGH (ref 11.5–15.5)
WBC: 37.6 10*3/uL — ABNORMAL HIGH (ref 4.0–10.5)
nRBC: 0 % (ref 0.0–0.2)

## 2018-11-19 LAB — LACTIC ACID, PLASMA
LACTIC ACID, VENOUS: 0.7 mmol/L (ref 0.5–1.9)
Lactic Acid, Venous: 1.3 mmol/L (ref 0.5–1.9)

## 2018-11-19 LAB — CREATININE, SERUM
Creatinine, Ser: 1.36 mg/dL — ABNORMAL HIGH (ref 0.61–1.24)
GFR calc Af Amer: >60 mL/min (ref >60)
GFR calc non Af Amer: >60 mL/min (ref >60)

## 2018-11-19 MED ORDER — CLOZAPINE 100 MG PO TABS
100.0000 mg | ORAL_TABLET | Freq: Every day | ORAL | Status: DC
  Administered 2018-11-19: 100 mg via ORAL
  Filled 2018-11-19: qty 1

## 2018-11-19 MED ORDER — PIPERACILLIN-TAZOBACTAM 3.375 G IVPB
3.3750 g | Freq: Three times a day (TID) | INTRAVENOUS | Status: DC
  Administered 2018-11-20 – 2018-11-21 (×4): 3.375 g via INTRAVENOUS
  Filled 2018-11-19 (×4): qty 50

## 2018-11-19 MED ORDER — ACETAMINOPHEN 325 MG PO TABS
650.0000 mg | ORAL_TABLET | Freq: Four times a day (QID) | ORAL | Status: DC | PRN
  Administered 2018-11-20: 650 mg via ORAL
  Filled 2018-11-19: qty 2

## 2018-11-19 MED ORDER — ENOXAPARIN SODIUM 40 MG/0.4ML ~~LOC~~ SOLN
40.0000 mg | SUBCUTANEOUS | Status: DC
  Administered 2018-11-19 – 2018-11-21 (×3): 40 mg via SUBCUTANEOUS
  Filled 2018-11-19 (×3): qty 0.4

## 2018-11-19 MED ORDER — ONDANSETRON HCL 4 MG/2ML IJ SOLN: 4.0000 mg | Freq: Four times a day (QID) | INTRAMUSCULAR | Status: DC | PRN

## 2018-11-19 MED ORDER — SODIUM CHLORIDE 0.9 % IV SOLN
INTRAVENOUS | Status: DC
  Administered 2018-11-19 – 2018-11-20 (×2): via INTRAVENOUS

## 2018-11-19 MED ORDER — HYDROCORTISONE 2.5 % RE CREA
TOPICAL_CREAM | Freq: Two times a day (BID) | RECTAL | Status: DC
  Administered 2018-11-19 – 2018-11-21 (×4): via RECTAL
  Administered 2018-11-21: 1 via RECTAL
  Filled 2018-11-19: qty 28.35

## 2018-11-19 MED ORDER — PANTOPRAZOLE SODIUM 40 MG PO TBEC
40.0000 mg | DELAYED_RELEASE_TABLET | Freq: Two times a day (BID) | ORAL | Status: DC
  Administered 2018-11-19 – 2018-11-22 (×6): 40 mg via ORAL
  Filled 2018-11-19 (×8): qty 1

## 2018-11-19 MED ORDER — ONDANSETRON HCL 4 MG PO TABS: 4.0000 mg | ORAL_TABLET | Freq: Four times a day (QID) | ORAL | Status: DC | PRN

## 2018-11-19 MED ORDER — OXYCODONE HCL 5 MG PO TABS
5.0000 mg | ORAL_TABLET | ORAL | Status: DC | PRN
  Administered 2018-11-19 – 2018-11-21 (×4): 5 mg via ORAL
  Filled 2018-11-19 (×4): qty 1

## 2018-11-19 MED ORDER — NICOTINE 21 MG/24HR TD PT24
21.0000 mg | MEDICATED_PATCH | Freq: Every day | TRANSDERMAL | Status: DC
  Administered 2018-11-19 – 2018-11-22 (×4): 21 mg via TRANSDERMAL
  Filled 2018-11-19 (×4): qty 1

## 2018-11-19 MED ORDER — ACETAMINOPHEN 650 MG RE SUPP: 650.0000 mg | Freq: Four times a day (QID) | RECTAL | Status: DC | PRN

## 2018-11-19 MED ORDER — PIPERACILLIN-TAZOBACTAM 3.375 G IVPB 30 MIN
3.3750 g | Freq: Once | INTRAVENOUS | Status: AC
  Administered 2018-11-19: 3.375 g via INTRAVENOUS
  Filled 2018-11-19 (×2): qty 50

## 2018-11-19 NOTE — Progress Notes (Signed)
Nathan Knox is a 42 y.o. male patient admitted from ED awake, alert - oriented  X 4 - no acute distress noted.  VSS - Blood pressure 100/66, pulse 97, temperature 98.7 F (37.1 C), temperature source Oral, resp. rate 16, SpO2 97 %.    IV in place, occlusive dsg intact without redness.  Orientation to room, and floor completed. Admission INP armband ID verified with patient/family, and in place. Fall assessment complete, with patient and family able to verbalize understanding of risk associated with falls, and verbalized understanding to call nsg before up out of bed.  Call light within reach, patient able to voice, and demonstrate understanding.  Skin, clean-dry- intact without evidence of bruising, or skin tears.   No evidence of skin break down noted on exam.     Will cont to eval and treat per MD orders.  [name], RN 11/19/2018 5:09 PM

## 2018-11-19 NOTE — H&P (Addendum)
TRH H&P    Patient Demographics:    Nathan Knox, is a 42 y.o. male  MRN: [id number]  DOB - Dec 30, 1975  Admit Date - 11/19/2018  Referring MD/NP/PA: Transferred from [place] Primary MD for the patient is Patient, No Pcp Per  Patient coming from: Transfer from [facility]  Chief complaint-abdominal pain   HPI:    Nathan Knox  is a 42 y.o. male, 42 year old male with a history of advanced bladder urothelial cancer(elected to stop chemotherapy few months ago), status post bilateral nephrostomy tube placement, schizophrenia, chronic kidney disease stage II, GI bleed with negative colonoscopy was discharged from the hospital on 11/07/2018.  Today patient went to [facility] ED with complaints of abdominal pain and he ran out of his pain medications.  At end of hospital he was found to have abnormal UA and elevated WBC 39.5, lactic acid 0.6, procalcitonin 5.99.  UA showed too numerous to count WBCs and 40-50 RBCs per high-power field.  Patient was transferred to [facility] for possible replacement of nephrostomy tubes per IR.  No IR was available at [facility] over the weekend.  Patient denies any fever or chills. Denies nausea vomiting or diarrhea. Denies chest pain or shortness of breath Complains of abdominal pain Complains of constipation No previous history of stroke or seizures Positive history of schizophrenia   Review of systems:    In addition to the HPI above,    All other systems reviewed and are negative.    Past History of the following :    Past Medical History:  Diagnosis Date  . CKD (chronic kidney disease), stage II   . Microcytic anemia   . Schizophrenia ([place])   . Tobacco abuse       Past Surgical History:  Procedure Laterality Date  . Bladder cancer removal    . COLONOSCOPY N/A 11/04/2018   Procedure: COLONOSCOPY;  Surgeon: Clarene Essex, MD;   Location: [facility] ENDOSCOPY;  Service: Endoscopy;  Laterality: N/A;  . HERNIA REPAIR    . NEPHROSTOMY Bilateral       Social History:      Social History   Tobacco Use  . Smoking status: Current Every Day Smoker  . Smokeless tobacco: Never Used  Substance Use Topics  . Alcohol use: Never    Frequency: Never       Family History :     Family History  Problem Relation Age of Onset  . COPD Mother   . Hypertension Father       Home Medications:   Prior to Admission medications   Medication Sig Start Date End Date Taking? Authorizing Provider  cloZAPine (CLOZARIL) 100 MG tablet Take 100 mg by mouth at bedtime. 09/23/18   [provider]  feeding supplement, ENSURE ENLIVE, (ENSURE ENLIVE) LIQD Take 237 mLs by mouth 3 (three) times daily between meals. 11/07/18   Eugenie Filler, MD  ferrous sulfate 325 (65 FE) MG tablet Take 1 tablet (325 mg total) by mouth 2 (two) times daily with a meal. 11/07/18  Eugenie Filler, MD  hydrocortisone (ANUSOL-HC) 2.5 % rectal cream Apply rectally 2 times daily 11/07/18 11/07/19  Eugenie Filler, MD  multivitamin (PROSIGHT) TABS tablet Take 1 tablet by mouth daily. 11/08/18   Eugenie Filler, MD  nicotine (NICODERM CQ - DOSED IN MG/24 HOURS) 21 mg/24hr patch Place 1 patch (21 mg total) onto the skin daily. 11/08/18   Eugenie Filler, MD  oxyCODONE (OXY IR/ROXICODONE) 5 MG immediate release tablet Take 1 tablet (5 mg total) by mouth every 3 (three) hours as needed for moderate pain. 11/07/18   Eugenie Filler, MD  pantoprazole (PROTONIX) 40 MG tablet Take 1 tablet (40 mg total) by mouth 2 (two) times daily. 11/07/18   Eugenie Filler, MD  protein supplement [facility] CHICKEN SOUP) POWD Take 7 g (2 oz total) by mouth 4 (four) times daily. 11/07/18   Eugenie Filler, MD     Allergies:     Allergies  Allergen Reactions  . Anesthesia S-I-60     Hyperthermia, coma-like statw     Physical Exam:   Vitals  Blood  pressure 100/66, pulse 97, temperature 98.7 F (37.1 C), temperature source Oral, resp. rate 16, SpO2 97 %.  1.  General: Appears in no acute distress  2. Psychiatric:  Intact judgement and  insight, awake alert, oriented x 3.  3. Neurologic: No focal neurological deficits, all cranial nerves intact.Strength 5/5 all 4 extremities, sensation intact all 4 extremities, plantars down going.  4. Eyes :  anicteric sclerae, moist conjunctivae with no lid lag. PERRLA.  5. ENMT:  Oropharynx clear with moist mucous membranes and good dentition  6. Neck:  supple, no cervical lymphadenopathy appriciated, No thyromegaly  7. Respiratory : Normal respiratory effort, lungs clear to auscultation bilaterally  8. Cardiovascular : S1-S2, regular, no murmurs rubs or gallops.  9. Gastrointestinal:  Abdomen is soft, mild generalized tenderness, mild distention.  No CVA tenderness elicited.  Bilateral nephrostomy tubes in place.      10. Skin:  No cyanosis, normal texture and turgor, no rash, lesions or ulcers  11.Musculoskeletal:  Good muscle tone,  joints appear normal , no effusions,  normal range of motion    Data Review:    CBC No results for input(s): WBC, HGB, HCT, PLT, MCV, MCH, MCHC, RDW, LYMPHSABS, MONOABS, EOSABS, BASOSABS, BANDABS in the last 168 hours.  Invalid input(s): NEUTRABS, BANDSABD ------------------------------------------------------------------------------------------------------------------  No results found for this or any previous visit (from the past 78 hour(s)).  Chemistries  No results for input(s): NA, K, CL, CO2, GLUCOSE, BUN, CREATININE, CALCIUM, MG, AST, ALT, ALKPHOS, BILITOT in the last 168 hours.  Invalid input(s):  GFRCGP ------------------------------------------------------------------------------------------------------------------  ------------------------------------------------------------------------------------------------------------------ GFR: CrCl cannot be calculated (Unknown ideal weight.). Liver Function Tests: No results for input(s): AST, ALT, ALKPHOS, BILITOT, PROT, ALBUMIN in the last 168 hours. No results for input(s): LIPASE, AMYLASE in the last 168 hours. No results for input(s): AMMONIA in the last 168 hours. Coagulation Profile: No results for input(s): INR, PROTIME in the last 168 hours. Cardiac Enzymes: No results for input(s): CKTOTAL, CKMB, CKMBINDEX, TROPONINI in the last 168 hours. BNP (last 3 results) No results for input(s): PROBNP in the last 8760 hours. HbA1C: No results for input(s): HGBA1C in the last 72 hours. CBG: No results for input(s): GLUCAP in the last 168 hours. Lipid Profile: No results for input(s): CHOL, HDL, LDLCALC, TRIG, CHOLHDL, LDLDIRECT in the last 72 hours. Thyroid Function Tests: No results for input(s): TSH, T4TOTAL, FREET4, T3FREE, THYROIDAB  in the last 72 hours. Anemia Panel: No results for input(s): VITAMINB12, FOLATE, FERRITIN, TIBC, IRON, RETICCTPCT in the last 72 hours.  --------------------------------------------------------------------------------------------------------------- Urine analysis:    Component Value Date/Time   COLORURINE STRAW (A) 11/04/2018 1136   APPEARANCEUR TURBID (A) 11/04/2018 1136   LABSPEC 1.026 11/04/2018 1136   PHURINE 7.0 11/04/2018 1136   GLUCOSEU NEGATIVE 11/04/2018 1136   HGBUR SMALL (A) 11/04/2018 1136   BILIRUBINUR NEGATIVE 11/04/2018 1136   KETONESUR NEGATIVE 11/04/2018 1136   PROTEINUR 100 (A) 11/04/2018 1136   NITRITE NEGATIVE 11/04/2018 1136   LEUKOCYTESUR MODERATE (A) 11/04/2018 1136      Imaging Results:  CT scan abdomen pelvis results reviewed today done at [facility] showed  mass in the left kidney and left ureter unchanged consistent with urothelial carcinoma.  There is left nephrostomy tube in the lower pole of the left kidney position of the nephrostomy tube is changed and moved inferiorly located compared to prior CT of December 9.  Right nephrostomy tube is unchanged.  Stable retroperitoneal and pelvic lymphadenopathy consistent with metastatic disease.  Large muscle bladder consistent with known bladder cancer.   Assessment & Plan:    Active Problems:   UTI (urinary tract infection)   1. Sepsis due to UTI-admit the patient, will obtain urine culture, start empiric Zosyn.  Initial lactic acid was 0.6 will repeat lactic acid.  Procalcitonin 5.99.  Patient is hemodynamically stable.  He has been at [facility] for more than 24 hours.  Would not give any aggressive IV hydration at this time.  Continue with normal saline at 75/h.  2. Stage IV bladder carcinoma, status post bilateral nephrostomy tube placement-both nephrostomy tubes are draining well, I called and discussed with urologist on-call Dr. Diona Fanti.  He does not recommend any replacement of tubes at this time.  He recommends treating for infection.  No urgent urological intervention indicated at this time.  Patient can follow-up with oncologist as outpatient for further discussion regarding treatment options.  3. Chronic kidney disease stage II-stable, creatinine 1.20, good urine output.  Will follow BMP in a.m.   DVT Prophylaxis-   Lovenox  AM Labs Ordered, also please review Full Orders  Family Communication: Admission, patients condition and plan of care including tests being ordered have been discussed with the patient and his family at bedside who indicate understanding and agree with the plan and Code Status.  Code Status: Full code  Admission status: Inpatient: Based on patients clinical presentation and evaluation of above clinical data, I have made determination that patient meets  Inpatient criteria at this time.  Patient is on IV antibiotics for sepsis due to UTI.  Time spent in minutes : 60 minutes   Oswald Hillock M.D on 11/19/2018 at 6:00 PM  Between 7am to 7pm - Pager - [phone]. After 7pm go to [email or web address] - password [facility]   [facility] - Office  [phone]

## 2018-11-19 NOTE — Progress Notes (Signed)
Pharmacy Antibiotic Note  Nathan Knox is a 42 y.o. male admitted on 11/19/2018 with  abdomial pain.  Pharmacy has been consulted for Zosyn dosing for sepsis.  H/o advanced bladder urothelial cancer(elected to stop chemotherapy few months ago), status post bilateral nephrostomy tube placement, CKD stage II.  GI bleed with negative colonoscopy was discharged from the hospital on 11/07/2018.  SCr this admit is pending.  SCr was 1.08 on 11/07/18.   Plan: Zosyn 3.375 gm IV q8h infuse over 4hrs. Monitor clinical status, renal function and culture results daily.   Temp (24hrs), Avg:98.7 F (37.1 C), Min:98.7 F (37.1 C), Max:98.7 F (37.1 C)  No results for input(s): WBC, CREATININE, LATICACIDVEN, VANCOTROUGH, VANCOPEAK, VANCORANDOM, GENTTROUGH, GENTPEAK, GENTRANDOM, TOBRATROUGH, TOBRAPEAK, TOBRARND, AMIKACINPEAK, AMIKACINTROU, AMIKACIN in the last 168 hours.  CrCl cannot be calculated (Unknown ideal weight.).    Allergies  Allergen Reactions  . Anesthesia S-I-60     Hyperthermia, coma-like statw    Thank you for allowing pharmacy to be a part of this patient's care. Nicole Cella, RPh Clinical Pharmacist Please check AMION for all [place] phone numbers After 10:00 PM, call [place] [phone] 11/19/2018 6:38 PM

## 2018-11-20 ENCOUNTER — Inpatient Hospital Stay (HOSPITAL_COMMUNITY): Payer: Medicare Other

## 2018-11-20 DIAGNOSIS — N390 Urinary tract infection, site not specified: Secondary | ICD-10-CM

## 2018-11-20 DIAGNOSIS — C679 Malignant neoplasm of bladder, unspecified: Secondary | ICD-10-CM

## 2018-11-20 DIAGNOSIS — F209 Schizophrenia, unspecified: Secondary | ICD-10-CM

## 2018-11-20 DIAGNOSIS — D72829 Elevated white blood cell count, unspecified: Secondary | ICD-10-CM

## 2018-11-20 LAB — CBC
HCT: 30.8 % — ABNORMAL LOW (ref 39.0–52.0)
HEMOGLOBIN: 9.1 g/dL — AB (ref 13.0–17.0)
MCH: 26.2 pg (ref 26.0–34.0)
MCHC: 29.5 g/dL — AB (ref 30.0–36.0)
MCV: 88.8 fL (ref 80.0–100.0)
Platelets: 438 10*3/uL — ABNORMAL HIGH (ref 150–400)
RBC: 3.47 MIL/uL — ABNORMAL LOW (ref 4.22–5.81)
RDW: 18.6 % — ABNORMAL HIGH (ref 11.5–15.5)
WBC: 39.4 10*3/uL — ABNORMAL HIGH (ref 4.0–10.5)
nRBC: 0 % (ref 0.0–0.2)

## 2018-11-20 LAB — URINE CULTURE: Culture: NO GROWTH

## 2018-11-20 LAB — COMPREHENSIVE METABOLIC PANEL
ALK PHOS: 104 U/L (ref 38–126)
ALT: 16 U/L (ref 0–44)
AST: 13 U/L — ABNORMAL LOW (ref 15–41)
Albumin: 1.8 g/dL — ABNORMAL LOW (ref 3.5–5.0)
Anion gap: 11 (ref 5–15)
BUN: 11 mg/dL (ref 6–20)
CO2: 23 mmol/L (ref 22–32)
Calcium: 11.1 mg/dL — ABNORMAL HIGH (ref 8.9–10.3)
Chloride: 101 mmol/L (ref 98–111)
Creatinine, Ser: 1.54 mg/dL — ABNORMAL HIGH (ref 0.61–1.24)
GFR calc Af Amer: >60 mL/min (ref >60)
GFR calc non Af Amer: 56 mL/min — ABNORMAL LOW (ref >60)
Glucose, Bld: 84 mg/dL (ref 70–99)
Potassium: 3.9 mmol/L (ref 3.5–5.1)
Sodium: 135 mmol/L (ref 135–145)
Total Bilirubin: 0.4 mg/dL (ref 0.3–1.2)
Total Protein: 7.1 g/dL (ref 6.5–8.1)

## 2018-11-20 MED ORDER — CLOZAPINE 100 MG PO TABS
100.0000 mg | ORAL_TABLET | Freq: Every day | ORAL | Status: DC
  Administered 2018-11-20 – 2018-11-22 (×3): 100 mg via ORAL
  Filled 2018-11-20 (×3): qty 1

## 2018-11-20 MED ORDER — BISACODYL 10 MG RE SUPP
10.0000 mg | Freq: Every day | RECTAL | Status: DC
  Administered 2018-11-20 – 2018-11-21 (×2): 10 mg via RECTAL
  Filled 2018-11-20 (×3): qty 1

## 2018-11-20 MED ORDER — DOCUSATE SODIUM 100 MG PO CAPS
200.0000 mg | ORAL_CAPSULE | Freq: Two times a day (BID) | ORAL | Status: DC
  Administered 2018-11-20 – 2018-11-22 (×5): 200 mg via ORAL
  Filled 2018-11-20 (×5): qty 2

## 2018-11-20 MED ORDER — MAGNESIUM HYDROXIDE 400 MG/5ML PO SUSP
30.0000 mL | Freq: Two times a day (BID) | ORAL | Status: AC
  Administered 2018-11-20 (×2): 30 mL via ORAL
  Filled 2018-11-20 (×2): qty 30

## 2018-11-20 MED ORDER — POLYETHYLENE GLYCOL 3350 17 G PO PACK
17.0000 g | PACK | Freq: Two times a day (BID) | ORAL | Status: DC
  Administered 2018-11-20 – 2018-11-22 (×5): 17 g via ORAL
  Filled 2018-11-20 (×5): qty 1

## 2018-11-20 MED ORDER — CLOZAPINE 100 MG PO TABS
200.0000 mg | ORAL_TABLET | Freq: Every day | ORAL | Status: DC
  Administered 2018-11-20 – 2018-11-21 (×2): 200 mg via ORAL
  Filled 2018-11-20 (×2): qty 2

## 2018-11-20 NOTE — Progress Notes (Signed)
PROGRESS NOTE                                                                                                                                                                                                             Patient Demographics:    Nathan Knox, is a 42 y.o. male, DOB - 08-30-1976, YWV:[id number]  Admit date - 11/19/2018   Admitting Physician Etta Quill, DO  Outpatient Primary MD for the patient is Patient, No Pcp Per  LOS - 1  CC -[name] from [facility] for leukocytosis and suspected UTI     Brief Narrative     Nathan Knox  is a 42 y.o. male, 42 year old male with a history of advanced bladder urothelial cancer(elected to stop chemotherapy few months ago) now is home hospice, status post bilateral nephrostomy tube placement, schizophrenia, chronic kidney disease stage II, GI bleed with negative colonoscopy was discharged from the hospital on 11/07/2018 with home hospice presented to [facility] ER with abdominal pain after he ran out of his chronic pain medications, there he was found to have an abnormal UA with leukocytosis and transferred here for UTI treatment in the presence of bilateral nephrostomy tubes.    Subjective:    Nathan Knox today has, No headache, No chest pain, No abdominal pain - No Nausea, No new weakness tingling or numbness, No Cough - SOB.  Neck back pain and some constipation.   Assessment  & Plan :     1.  Complicated UTI in the presence of bilateral nephrostomy tubes.  Currently afebrile and nontoxic, continue empiric Zosyn and follow cultures.  2.  Transurethral metastatic carcinoma.  Extremely poor prognosis, reviewed his chart from [facility] and discussed his case with his primary oncologist Dr. [place] Sink on 11/20/2018.  His prognosis is very poor.  He had stopped his chemotherapy several months ago and was recently placed on home hospice.  Goal of care will be gentle medical treatment directed towards comfort, will  be discharged with home hospice when he is better from #1 standpoint.  3.  Advanced schizophrenia.  Currently has insight continue home medications.  4.  Severe narcotic bowel and constipation.  Placed on bowel regimen.  5.  Leukocytosis.  Currently no signs of sepsis, likely due to #1 above on top of chronic leukocytosis, reviewed his chart from [facility] his baseline WBC count is between 20-25,000.  6.  CKD 3 with mild ARF.  Baseline creatinine around 1.2.  Hydrate and monitor.  On admission  his case was discussed by admitting physician with urologist on-call Dr. Diona Fanti.  No intervention from their standpoint.  CT scan does not find any hydronephrosis or signs of nephrostomy tube blockage.    Family Communication  : mother  Code Status :  Full  Disposition Plan  :  TBD  Consults  :    I discussed the case with patient's primary oncologist Dr. [place] Sink in [place] on 11/20/2018.    Urology Dr. Diona Fanti over the phone by the admitting physician.   Procedures  :    CT abdomen and pelvis.    1. Extensive urothelial neoplasm with huge bladder mass, multifocal urothelial carcinoma throughout the left ureter, and infiltrative urothelial carcinoma in the left kidney, as well as metastatic pelvic and retroperitoneal lymphadenopathy, as detailed above. These findings were better demonstrated on yesterday's contrast enhanced CT the abdomen and pelvis dated 11/19/2018. 2. Bilateral nephrostomy tubes in position. 3. Trace bilateral pleural effusions lying dependently. 4. Trace volume of ascites.  DVT Prophylaxis  :  Lovenox    Lab Results  Component Value Date   PLT 438 (H) 11/20/2018    Diet :  Diet Order            Diet regular Room service appropriate? Yes; Fluid consistency: Thin  Diet effective now               Inpatient Medications Scheduled Meds: . bisacodyl  10 mg Rectal Daily  . cloZAPine  100 mg Oral QHS  . docusate sodium  200 mg Oral BID  . enoxaparin  (LOVENOX) injection  40 mg Subcutaneous Q24H  . hydrocortisone   Rectal BID  . magnesium hydroxide  30 mL Oral BID  . nicotine  21 mg Transdermal Daily  . pantoprazole  40 mg Oral BID  . polyethylene glycol  17 g Oral BID   Continuous Infusions: . sodium chloride 75 mL/hr at 11/19/18 1858  . piperacillin-tazobactam (ZOSYN)  IV 3.375 g (11/20/18 0504)   PRN Meds:.acetaminophen **OR** acetaminophen, [DISCONTINUED] ondansetron **OR** ondansetron (ZOFRAN) IV, oxyCODONE  Antibiotics  :   Anti-infectives (From admission, onward)   Start     Dose/Rate Route Frequency Ordered Stop   11/20/18 0600  piperacillin-tazobactam (ZOSYN) IVPB 3.375 g     3.375 g 12.5 mL/hr over 240 Minutes Intravenous Every 8 hours 11/19/18 1846     11/19/18 1830  piperacillin-tazobactam (ZOSYN) IVPB 3.375 g     3.375 g 100 mL/hr over 30 Minutes Intravenous  Once 11/19/18 1822 11/19/18 1930          Objective:   Vitals:   11/19/18 1654 11/19/18 2039 11/20/18 0433 11/20/18 0532  BP: 100/66 (!) 103/58 (!) 100/48 (!) 102/57  Knox: 97 98 (!) 115   Resp: 16 16 17    Temp: 98.7 F (37.1 C) 98.7 F (37.1 C) 99.4 F (37.4 C)   TempSrc: Oral Oral Oral   SpO2: 97% 100% 99%     Wt Readings from Last 3 Encounters:  11/04/18 61.8 kg     Intake/Output Summary (Last 24 hours) at 11/20/2018 1025 Last data filed at 11/20/2018 0836 Gross per 24 hour  Intake 911.91 ml  Output 550 ml  Net 361.91 ml     Physical Exam  Awake Alert,  No new F.N deficits, Normal affect Nathan Knox,PERRAL Supple Neck,No JVD, No cervical lymphadenopathy appriciated.  Symmetrical Chest wall movement, Good air movement bilaterally, CTAB RRR,No Gallops,Rubs or new Murmurs, No Parasternal Heave +ve B.Sounds, Abd Soft, No tenderness,  No organomegaly appriciated, No rebound - guarding or rigidity. No Cyanosis, Clubbing or edema, Bilat Nephrostomy tubes in place    Data Review:    CBC Recent Labs  Lab 11/19/18 1849 11/20/18 0139    WBC 37.6* 39.4*  HGB 9.1* 9.1*  HCT 30.2* 30.8*  PLT 400 438*  MCV 86.8 88.8  MCH 26.1 26.2  MCHC 30.1 29.5*  RDW 18.6* 18.6*    Chemistries  Recent Labs  Lab 11/19/18 1849 11/20/18 0139  NA  --  135  K  --  3.9  CL  --  101  CO2  --  23  GLUCOSE  --  84  BUN  --  11  CREATININE 1.36* 1.54*  CALCIUM  --  11.1*  AST  --  13*  ALT  --  16  ALKPHOS  --  104  BILITOT  --  0.4   ------------------------------------------------------------------------------------------------------------------ No results for input(s): CHOL, HDL, LDLCALC, TRIG, CHOLHDL, LDLDIRECT in the last 72 hours.  No results found for: HGBA1C ------------------------------------------------------------------------------------------------------------------ No results for input(s): TSH, T4TOTAL, T3FREE, THYROIDAB in the last 72 hours.  Invalid input(s): FREET3 ------------------------------------------------------------------------------------------------------------------ No results for input(s): VITAMINB12, FOLATE, FERRITIN, TIBC, IRON, RETICCTPCT in the last 72 hours.  Coagulation profile No results for input(s): INR, PROTIME in the last 168 hours.  No results for input(s): DDIMER in the last 72 hours.  Cardiac Enzymes No results for input(s): CKMB, TROPONINI, MYOGLOBIN in the last 168 hours.  Invalid input(s): CK ------------------------------------------------------------------------------------------------------------------ No results found for: BNP  Micro Results No results found for this or any previous visit (from the past 240 hour(s)).  Radiology Reports Ct Abdomen Pelvis Wo Contrast  Result Date: 11/20/2018 CLINICAL DATA:  42 year old male with history of urinary tract infection. Suspected urinary tract calculus. EXAM: CT ABDOMEN AND PELVIS WITHOUT CONTRAST TECHNIQUE: Multidetector CT imaging of the abdomen and pelvis was performed following the standard protocol without IV contrast.  COMPARISON:  CT the abdomen and pelvis 11/19/2018. FINDINGS: Lower chest: Trace bilateral pleural effusions lying dependently. Subsegmental atelectasis in the right lung base. Hepatobiliary: No definite suspicious cystic or solid hepatic lesions are confidently identified on today's noncontrast CT examination. Unenhanced appearance of the gallbladder is normal. Pancreas: No definite pancreatic mass or peripancreatic fluid or inflammatory changes are identified on today's noncontrast CT examination. Spleen: Unremarkable. Adrenals/Urinary Tract: Bilateral nephrostomy tubes are again noted. The right nephrostomy tube is reformed in the right renal pelvis. The left nephrostomy tube projects over the inferior pole of the left kidney. Small amount of gas in the upper pole collecting system of the right kidney, iatrogenic. Small amount of high attenuation in the parenchyma in the lower pole of the right kidney, favored to reflect a small amount of intraparenchymal hemorrhage. Known infiltrative mass in the left kidney is not well demonstrated on today's noncontrast CT examination. Known multifocal urothelial neoplasm in the left ureter is also not well demonstrated on today's noncontrast CT examination, however, the left ureter is diffusely dilated and there multiple soft tissue attenuation regions within the left ureter which correspond to enhancing soft tissue on the prior CT the abdomen and pelvis, compatible with extensive urothelial tumor along the course of the left ureter. No right-sided hydroureteronephrosis. Urinary bladder is diffusely enlarged and heterogeneous in attenuation, corresponding to the large infiltrative bladder mass better demonstrated on recent contrast enhanced CT the abdomen and pelvis dated 11/19/2018. Urinary bladder currently measures approximately 13.3 x 10.0 x 14.0 cm, nearly all of which appears to correspond to  malignant soft tissue on the prior CT examination. Stomach/Bowel: Normal  appearance of the stomach. No pathologic dilatation of small bowel or colon. Normal appendix. Vascular/Lymphatic: No atherosclerotic calcifications in the abdominal aorta or pelvic vasculature. Extensive retroperitoneal lymphadenopathy better demonstrated on yesterday's contrast enhanced CT examination, with left para-aortic lymph nodes measuring up to at least 2 cm in short axis (axial image 27 of series 3). Left-sided iliac lymph nodes also noted measuring up to 1 cm in short axis. Reproductive: Prostate gland and seminal vesicles are poorly demonstrated on today's noncontrast CT examination. Other: Trace volume of ascites.  No pneumoperitoneum. Musculoskeletal: There are no aggressive appearing lytic or blastic lesions noted in the visualized portions of the skeleton. IMPRESSION: 1. Extensive urothelial neoplasm with huge bladder mass, multifocal urothelial carcinoma throughout the left ureter, and infiltrative urothelial carcinoma in the left kidney, as well as metastatic pelvic and retroperitoneal lymphadenopathy, as detailed above. These findings were better demonstrated on yesterday's contrast enhanced CT the abdomen and pelvis dated 11/19/2018. 2. Bilateral nephrostomy tubes in position. 3. Trace bilateral pleural effusions lying dependently. 4. Trace volume of ascites. Electronically Signed   By: Vinnie Langton M.D.   On: 11/20/2018 09:36   Dg Chest 2 View  Result Date: 11/04/2018 CLINICAL DATA:  Leukocytosis EXAM: CHEST - 2 VIEW COMPARISON:  10/30/2018 FINDINGS: Normal heart size, mediastinal contours, and pulmonary vascularity. Lungs clear. No pleural effusion or pneumothorax. Bones unremarkable. IMPRESSION: Normal exam. Electronically Signed   By: Lavonia Dana M.D.   On: 11/04/2018 17:46    Time Spent in minutes  30   Lala Lund M.D on 11/20/2018 at 10:25 AM  To page go to [email or web address] - password [email or web address]

## 2018-11-21 DIAGNOSIS — N182 Chronic kidney disease, stage 2 (mild): Secondary | ICD-10-CM

## 2018-11-21 LAB — BASIC METABOLIC PANEL
Anion gap: 11 (ref 5–15)
BUN: 9 mg/dL (ref 6–20)
CALCIUM: 11.4 mg/dL — AB (ref 8.9–10.3)
CO2: 24 mmol/L (ref 22–32)
Chloride: 103 mmol/L (ref 98–111)
Creatinine, Ser: 1.54 mg/dL — ABNORMAL HIGH (ref 0.61–1.24)
GFR calc Af Amer: >60 mL/min (ref >60)
GFR calc non Af Amer: 56 mL/min — ABNORMAL LOW (ref >60)
Glucose, Bld: 113 mg/dL — ABNORMAL HIGH (ref 70–99)
Potassium: 3.6 mmol/L (ref 3.5–5.1)
Sodium: 138 mmol/L (ref 135–145)

## 2018-11-21 LAB — CBC
HEMATOCRIT: 30.4 % — AB (ref 39.0–52.0)
Hemoglobin: 9.1 g/dL — ABNORMAL LOW (ref 13.0–17.0)
MCH: 25.9 pg — ABNORMAL LOW (ref 26.0–34.0)
MCHC: 29.9 g/dL — ABNORMAL LOW (ref 30.0–36.0)
MCV: 86.6 fL (ref 80.0–100.0)
Platelets: 360 10*3/uL (ref 150–400)
RBC: 3.51 MIL/uL — ABNORMAL LOW (ref 4.22–5.81)
RDW: 18.2 % — ABNORMAL HIGH (ref 11.5–15.5)
WBC: 38.1 10*3/uL — AB (ref 4.0–10.5)
nRBC: 0 % (ref 0.0–0.2)

## 2018-11-21 MED ORDER — CEPHALEXIN 500 MG PO CAPS
500.0000 mg | ORAL_CAPSULE | Freq: Three times a day (TID) | ORAL | Status: DC
  Administered 2018-11-21 – 2018-11-22 (×3): 500 mg via ORAL
  Filled 2018-11-21 (×3): qty 1

## 2018-11-21 NOTE — Progress Notes (Signed)
PROGRESS NOTE                                                                                                                                                                                                             Patient Demographics:    Nathan Knox, is a 42 y.o. male, DOB - [birth year], DDU:[id number]  Admit date - 11/19/2018   Admitting Physician Etta Quill, DO  Outpatient Primary MD for the patient is Patient, No Pcp Per  LOS - 2  CC -[name] from [facility] for leukocytosis and suspected UTI     Brief Narrative     Nathan Knox  is a 42 y.o. 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 ER with abdominal pain after he ran out of his chronic pain medications, there he was found to have an abnormal UA with leukocytosis and transferred here for UTI treatment in the presence of bilateral nephrostomy tubes.    Subjective:   Patient in bed, appears comfortable, denies any headache, no fever, no chest pain or pressure, no shortness of breath , no abdominal pain. No focal weakness.   Assessment  & Plan :     1.  Complicated UTI in the presence of bilateral nephrostomy tubes.  Currently afebrile and nontoxic, continue empiric Zosyn and follow cultures.  2.  Transurethral metastatic carcinoma.  Extremely poor prognosis, reviewed his chart from [facility] and discussed his case with his primary oncologist Dr. Perry Sink on 11/20/2018.  His prognosis is very poor.  He had stopped his chemotherapy several months ago and was recently placed on home hospice.  Goal of care will be gentle medical treatment directed towards comfort, will be discharged with  home hospice when he is better from #1 standpoint.  3.  Advanced schizophrenia.  Currently has insight continue home medications.  4.  Severe narcotic bowel and constipation.  Placed on bowel regimen.  5.  Leukocytosis.  Currently no signs of sepsis, likely due to #1 above on top of chronic leukocytosis, reviewed his chart from [facility] his baseline WBC count is between 20,000-25,000.  6.  CKD 3 with mild ARF.  Baseline creatinine around 1.2.  Hydrate and monitor.  On admission his case was discussed by admitting physician  with urologist on-call Dr. Diona Fanti.  No intervention from their standpoint.  CT scan does not find any hydronephrosis or signs of nephrostomy tube blockage.    Family Communication  : mother  Code Status :  Full  Disposition Plan  :  TBD  Consults  :    I discussed the case with patient's primary oncologist Dr. [place] Sink in [place] on 11/20/2018.    Urology Dr. Diona Fanti over the phone by the admitting physician.   Procedures  :    CT abdomen and pelvis.    1. Extensive urothelial neoplasm with huge bladder mass, multifocal urothelial carcinoma throughout the left ureter, and infiltrative urothelial carcinoma in the left kidney, as well as metastatic pelvic and retroperitoneal lymphadenopathy, as detailed above. These findings were better demonstrated on yesterday's contrast enhanced CT the abdomen and pelvis dated 11/19/2018. 2. Bilateral nephrostomy tubes in position. 3. Trace bilateral pleural effusions lying dependently. 4. Trace volume of ascites.  DVT Prophylaxis  :  Lovenox    Lab Results  Component Value Date   PLT 360 11/21/2018    Diet :  Diet Order            Diet regular Room service appropriate? Yes; Fluid consistency: Thin  Diet effective now               Inpatient Medications Scheduled Meds: . bisacodyl  10 mg Rectal Daily  . cloZAPine  100 mg Oral Daily   And  . cloZAPine  200 mg Oral QHS  . docusate sodium  200 mg Oral  BID  . enoxaparin (LOVENOX) injection  40 mg Subcutaneous Q24H  . hydrocortisone   Rectal BID  . nicotine  21 mg Transdermal Daily  . pantoprazole  40 mg Oral BID  . polyethylene glycol  17 g Oral BID   Continuous Infusions: . sodium chloride 75 mL/hr at 11/20/18 1534  . piperacillin-tazobactam (ZOSYN)  IV 3.375 g (11/21/18 0527)   PRN Meds:.acetaminophen **OR** acetaminophen, [DISCONTINUED] ondansetron **OR** ondansetron (ZOFRAN) IV, oxyCODONE  Antibiotics  :   Anti-infectives (From admission, onward)   Start     Dose/Rate Route Frequency Ordered Stop   11/20/18 0600  piperacillin-tazobactam (ZOSYN) IVPB 3.375 g     3.375 g 12.5 mL/hr over 240 Minutes Intravenous Every 8 hours 11/19/18 1846     11/19/18 1830  piperacillin-tazobactam (ZOSYN) IVPB 3.375 g     3.375 g 100 mL/hr over 30 Minutes Intravenous  Once 11/19/18 1822 11/19/18 1930          Objective:   Vitals:   11/20/18 0532 11/20/18 1320 11/20/18 2056 11/21/18 0535  BP: (!) 102/57 (!) 102/58 (!) 103/57 111/60  Pulse:  (!) 104 (!) 108 100  Resp:  19 16 19   Temp:  99.5 F (37.5 C) (!) 101.3 F (38.5 C) 98.2 F (36.8 C)  TempSrc:  Oral Oral Oral  SpO2:  99% 100% 100%    Wt Readings from Last 3 Encounters:  11/04/18 61.8 kg     Intake/Output Summary (Last 24 hours) at 11/21/2018 1202 Last data filed at 11/21/2018 1023 Gross per 24 hour  Intake 2394.85 ml  Output 750 ml  Net 1644.85 ml     Physical Exam  Awake Alert, Oriented X 3, No new F.N deficits, Normal affect, no bizarre ideation or hallucinations [place].AT,PERRAL Supple Neck,No JVD, No cervical lymphadenopathy appriciated.  Symmetrical Chest wall movement, Good air movement bilaterally, CTAB RRR,No Gallops, Rubs or new Murmurs, No Parasternal Heave +ve B.Sounds,  Abd Soft, No tenderness, No organomegaly appriciated, No rebound - guarding or rigidity. No Cyanosis, Clubbing or edema, No new Rash or bruise, Bilat Nephrostomy tubes in place    Data  Review:    CBC Recent Labs  Lab 11/19/18 1849 11/20/18 0139 11/21/18 0345  WBC 37.6* 39.4* 38.1*  HGB 9.1* 9.1* 9.1*  HCT 30.2* 30.8* 30.4*  PLT 400 438* 360  MCV 86.8 88.8 86.6  MCH 26.1 26.2 25.9*  MCHC 30.1 29.5* 29.9*  RDW 18.6* 18.6* 18.2*    Chemistries  Recent Labs  Lab 11/19/18 1849 11/20/18 0139 11/21/18 0345  NA  --  135 138  K  --  3.9 3.6  CL  --  101 103  CO2  --  23 24  GLUCOSE  --  84 113*  BUN  --  11 9  CREATININE 1.36* 1.54* 1.54*  CALCIUM  --  11.1* 11.4*  AST  --  13*  --   ALT  --  16  --   ALKPHOS  --  104  --   BILITOT  --  0.4  --    ------------------------------------------------------------------------------------------------------------------ No results for input(s): CHOL, HDL, LDLCALC, TRIG, CHOLHDL, LDLDIRECT in the last 72 hours.  No results found for: HGBA1C ------------------------------------------------------------------------------------------------------------------ No results for input(s): TSH, T4TOTAL, T3FREE, THYROIDAB in the last 72 hours.  Invalid input(s): FREET3 ------------------------------------------------------------------------------------------------------------------ No results for input(s): VITAMINB12, FOLATE, FERRITIN, TIBC, IRON, RETICCTPCT in the last 72 hours.  Coagulation profile No results for input(s): INR, PROTIME in the last 168 hours.  No results for input(s): DDIMER in the last 72 hours.  Cardiac Enzymes No results for input(s): CKMB, TROPONINI, MYOGLOBIN in the last 168 hours.  Invalid input(s): CK ------------------------------------------------------------------------------------------------------------------ No results found for: BNP  Micro Results Recent Results (from the past 240 hour(s))  Urine culture     Status: None   Collection Time: 11/19/18  6:38 PM  Result Value Ref Range Status   Specimen Description URINE, CLEAN CATCH  Final   Special Requests NONE  Final   Culture    Final    NO GROWTH Performed at [facility], [address], [place], [place] [address]    Report Status 11/20/2018 FINAL  Final    Radiology Reports Ct Abdomen Pelvis Wo Contrast  Result Date: 11/20/2018 CLINICAL DATA:  42 year old male with history of urinary tract infection. Suspected urinary tract calculus. EXAM: CT ABDOMEN AND PELVIS WITHOUT CONTRAST TECHNIQUE: Multidetector CT imaging of the abdomen and pelvis was performed following the standard protocol without IV contrast. COMPARISON:  CT the abdomen and pelvis 11/19/2018. FINDINGS: Lower chest: Trace bilateral pleural effusions lying dependently. Subsegmental atelectasis in the right lung base. Hepatobiliary: No definite suspicious cystic or solid hepatic lesions are confidently identified on today's noncontrast CT examination. Unenhanced appearance of the gallbladder is normal. Pancreas: No definite pancreatic mass or peripancreatic fluid or inflammatory changes are identified on today's noncontrast CT examination. Spleen: Unremarkable. Adrenals/Urinary Tract: Bilateral nephrostomy tubes are again noted. The right nephrostomy tube is reformed in the right renal pelvis. The left nephrostomy tube projects over the inferior pole of the left kidney. Small amount of gas in the upper pole collecting system of the right kidney, iatrogenic. Small amount of high attenuation in the parenchyma in the lower pole of the right kidney, favored to reflect a small amount of intraparenchymal hemorrhage. Known infiltrative mass in the left kidney is not well demonstrated on today's noncontrast CT examination. Known multifocal urothelial neoplasm in the  left ureter is also not well demonstrated on today's noncontrast CT examination, however, the left ureter is diffusely dilated and there multiple soft tissue attenuation regions within the left ureter which correspond to enhancing soft tissue on the prior CT the abdomen and pelvis, compatible with extensive  urothelial tumor along the course of the left ureter. No right-sided hydroureteronephrosis. Urinary bladder is diffusely enlarged and heterogeneous in attenuation, corresponding to the large infiltrative bladder mass better demonstrated on recent contrast enhanced CT the abdomen and pelvis dated 11/19/2018. Urinary bladder currently measures approximately 13.3 x 10.0 x 14.0 cm, nearly all of which appears to correspond to malignant soft tissue on the prior CT examination. Stomach/Bowel: Normal appearance of the stomach. No pathologic dilatation of small bowel or colon. Normal appendix. Vascular/Lymphatic: No atherosclerotic calcifications in the abdominal aorta or pelvic vasculature. Extensive retroperitoneal lymphadenopathy better demonstrated on yesterday's contrast enhanced CT examination, with left para-aortic lymph nodes measuring up to at least 2 cm in short axis (axial image 27 of series 3). Left-sided iliac lymph nodes also noted measuring up to 1 cm in short axis. Reproductive: Prostate gland and seminal vesicles are poorly demonstrated on today's noncontrast CT examination. Other: Trace volume of ascites.  No pneumoperitoneum. Musculoskeletal: There are no aggressive appearing lytic or blastic lesions noted in the visualized portions of the skeleton. IMPRESSION: 1. Extensive urothelial neoplasm with huge bladder mass, multifocal urothelial carcinoma throughout the left ureter, and infiltrative urothelial carcinoma in the left kidney, as well as metastatic pelvic and retroperitoneal lymphadenopathy, as detailed above. These findings were better demonstrated on yesterday's contrast enhanced CT the abdomen and pelvis dated 11/19/2018. 2. Bilateral nephrostomy tubes in position. 3. Trace bilateral pleural effusions lying dependently. 4. Trace volume of ascites. Electronically Signed   By: Vinnie Langton M.D.   On: 11/20/2018 09:36   Dg Chest 2 View  Result Date: 11/04/2018 CLINICAL DATA:  Leukocytosis  EXAM: CHEST - 2 VIEW COMPARISON:  10/30/2018 FINDINGS: Normal heart size, mediastinal contours, and pulmonary vascularity. Lungs clear. No pleural effusion or pneumothorax. Bones unremarkable. IMPRESSION: Normal exam. Electronically Signed   By: Lavonia Dana M.D.   On: 11/04/2018 17:46    Time Spent in minutes  30   Lala Lund M.D on 11/21/2018 at 12:02 PM  To page go to [email or web address] - password [email or web address]

## 2018-11-21 NOTE — Care Management Note (Signed)
Case Management Note  Patient Details  Name: Nathan Knox MRN: [id number] Date of Birth: January 09, 1976  Subjective/Objective:    Pt admitted with UTI. He has bilateral nephrostomy tubes.  Pt is [facility] home with [facility].                Action/Plan: Plan is for patient to return home with hospice when medically ready. CM following.  Expected Discharge Date:                  Expected Discharge Plan:  Home w Hospice Care  In-House Referral:     Discharge planning Services  CM Consult  Post Acute Care Choice:    Choice offered to:     DME Arranged:    DME Agency:     HH Arranged:    HH Agency:  [facility]  Status of Service:  In process, will continue to follow  If discussed at Long Length of Stay Meetings, dates discussed:    Additional Comments:  Pollie Friar, RN 11/21/2018, 7:05 PM

## 2018-11-22 ENCOUNTER — Other Ambulatory Visit: Payer: Self-pay

## 2018-11-22 LAB — BASIC METABOLIC PANEL
Anion gap: 7 (ref 5–15)
BUN: 11 mg/dL (ref 6–20)
CALCIUM: 11.8 mg/dL — AB (ref 8.9–10.3)
CO2: 22 mmol/L (ref 22–32)
Chloride: 110 mmol/L (ref 98–111)
Creatinine, Ser: 1.54 mg/dL — ABNORMAL HIGH (ref 0.61–1.24)
GFR calc Af Amer: >60 mL/min (ref >60)
GFR, EST NON AFRICAN AMERICAN: 56 mL/min — AB (ref >60)
GLUCOSE: 116 mg/dL — AB (ref 70–99)
Potassium: 3.7 mmol/L (ref 3.5–5.1)
Sodium: 139 mmol/L (ref 135–145)

## 2018-11-22 LAB — CBC
HCT: 27.7 % — ABNORMAL LOW (ref 39.0–52.0)
Hemoglobin: 8.2 g/dL — ABNORMAL LOW (ref 13.0–17.0)
MCH: 25.5 pg — ABNORMAL LOW (ref 26.0–34.0)
MCHC: 29.6 g/dL — ABNORMAL LOW (ref 30.0–36.0)
MCV: 86 fL (ref 80.0–100.0)
PLATELETS: 376 10*3/uL (ref 150–400)
RBC: 3.22 MIL/uL — ABNORMAL LOW (ref 4.22–5.81)
RDW: 18.4 % — ABNORMAL HIGH (ref 11.5–15.5)
WBC: 38.8 10*3/uL — ABNORMAL HIGH (ref 4.0–10.5)
nRBC: 0 % (ref 0.0–0.2)

## 2018-11-22 MED ORDER — OXYCODONE HCL 5 MG PO TABS: 5.0000 mg | ORAL_TABLET | ORAL | 0 refills | Status: AC | PRN

## 2018-11-22 MED ORDER — BISACODYL 10 MG RE SUPP: 10.0000 mg | Freq: Every day | RECTAL | 0 refills | Status: AC | PRN

## 2018-11-22 MED ORDER — POLYETHYLENE GLYCOL 3350 17 G PO PACK: 17.0000 g | PACK | Freq: Two times a day (BID) | ORAL | 0 refills | Status: AC

## 2018-11-22 NOTE — Discharge Summary (Signed)
Nathan Knox GTX:[id number] DOB: [birth year] DOA: 11/19/2018  PCP: Nathan Knox  Admit date: 11/19/2018  Discharge date: 11/22/2018  Admitted From: Home   Disposition:  Home with Hospice   Recommendations for Outpatient Follow-up:   Follow up with PCP in 1-2 weeks  PCP Please obtain BMP/CBC, 2 view CXR in 1week,  (see Discharge instructions)   PCP Please follow up on the following pending results:    Home Health: Hospice   Equipment/Devices: None  Consultations:   I discussed the case with patient's primary oncologist Dr. La Vernia Knox in [place] on 11/20/2018.    Urology Dr. Diona Knox over the phone by the admitting physician.  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                                                                 Hospital Course    1.  ? Complicated UTI in the presence of bilateral nephrostomy tubes.  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  2.  Transurethral metastatic carcinoma.  Extremely poor prognosis, reviewed his chart from [facility] and discussed his case with his primary oncologist Dr. [place] Knox on 11/20/2018.  His prognosis is very poor.  He had stopped his chemotherapy several months ago and was recently placed on home hospice.  Goal of care will be gentle medical treatment directed towards comfort, will be discharged with home hospice, plan discussed with mother and brother both agree with hospice and comfort measures, he will be discharged with home hospice today.  3.  Advanced schizophrenia.  Currently has insight continue home medications.  4.  Severe narcotic bowel and constipation.  Placed on bowel regimen.  5.  Leukocytosis.  Currently  no signs of sepsis, likely due to #1 above on top of chronic leukocytosis, reviewed his chart from [facility] his baseline WBC count is between 20,000-25,000.  6.  CKD 3 with mild ARF.  Baseline creatinine around 1.2.  Hydrate and monitor.  On admission his case was discussed by admitting physician with urologist on-call Dr. Diona Knox.  No intervention from their standpoint.  CT scan does not find any hydronephrosis or signs of nephrostomy tube blockage.   Discharge diagnosis     Principal Problem:   UTI (urinary tract infection) Active Problems:   Bladder cancer (HCC)   CKD (chronic kidney disease), stage II   Schizophrenia (HCC)   Leukocytosis   Generalized pain    Discharge instructions    Discharge Instructions    Discharge instructions   Complete by:  As directed    Disposition.  Home with  hospice Condition.  Guarded CODE STATUS.  For now wants to be full code but will consider DNR soon Diet.  Regular as tolerated. Activity.  As tolerated      Discharge Medications   Allergies as of 11/22/2018      Reactions   Anesthesia S-[id number] Other (See Comments)   Hyperthermia, coma-like state      Medication List    STOP taking these medications   ferrous sulfate 325 (65 FE) MG tablet   multivitamin Tabs tablet   nicotine 21 mg/24hr patch Commonly known as:  NICODERM CQ - dosed in mg/24 hours   protein supplement Powd Commonly known as:  UNJURY CHICKEN SOUP     TAKE these medications   acetaminophen 500 MG tablet Commonly known as:  TYLENOL Take 500-1,000 mg by mouth every 6 (six) hours as needed for headache (pain).   bisacodyl 10 MG suppository Commonly known as:  DULCOLAX Place 1 suppository (10 mg total) rectally daily as needed for moderate constipation.   cloZAPine 100 MG tablet Commonly known as:  CLOZARIL Take 100 mg by mouth at bedtime.   feeding supplement (ENSURE ENLIVE) Liqd Take 237 mLs by mouth 3 (three) times daily between meals. What changed:  when to take this   hydrocortisone 2.5 % rectal cream Commonly known as:  ANUSOL-HC Apply rectally 2 times daily What changed:    how much to take  how to take this  when to take this  additional instructions   oxyCODONE 5 MG immediate release tablet Commonly known as:  Oxy IR/ROXICODONE Take 1 tablet (5 mg total) by mouth every 3 (three) hours as needed for moderate pain.   pantoprazole 40 MG tablet Commonly known as:  PROTONIX Take 1 tablet (40 mg total) by mouth 2 (two) times daily.   polyethylene glycol packet Commonly known as:  MIRALAX / GLYCOLAX Take 17 g by mouth 2 (two) times daily.       Follow-up Information    Nathan Knox, Nathan A, MD. Schedule an appointment as soon as possible for Knox visit in 1 week(s).   Specialty:  Oncology Contact information: [place]. [place] [address] [phone]           Major procedures and Radiology Reports - PLEASE review detailed and final reports thoroughly  -         Ct Abdomen Pelvis Wo Contrast  Result Date: 11/20/2018 CLINICAL DATA:  42 year old male with history of urinary tract infection. Suspected urinary tract calculus. EXAM: CT ABDOMEN AND PELVIS WITHOUT CONTRAST TECHNIQUE: Multidetector CT imaging of the abdomen and pelvis was performed following the  standard protocol without IV contrast. COMPARISON:  CT the abdomen and pelvis 11/19/2018. FINDINGS: Lower chest: Trace bilateral pleural effusions lying dependently. Subsegmental atelectasis in the right lung base. Hepatobiliary: No definite suspicious cystic or solid hepatic lesions are confidently identified on today's noncontrast CT examination. Unenhanced appearance of the gallbladder is normal. Pancreas: No definite pancreatic mass or peripancreatic fluid or inflammatory changes are identified on today's noncontrast CT examination. Spleen: Unremarkable. Adrenals/Urinary Tract: Bilateral nephrostomy tubes are again noted. The right nephrostomy tube is reformed in the right renal pelvis. The left nephrostomy tube projects over the inferior pole of the left kidney. Small amount of gas in the upper pole collecting system of the right kidney, iatrogenic. Small amount of high attenuation in the parenchyma in the lower pole of the right kidney, favored to reflect Knox small amount of intraparenchymal hemorrhage. Known infiltrative mass in the left kidney is not well demonstrated on today's noncontrast CT examination. Known multifocal urothelial neoplasm in the left ureter is also not well demonstrated on today's noncontrast CT examination, however, the left ureter is diffusely dilated and there multiple soft tissue attenuation regions within the left ureter which correspond to enhancing soft tissue on the prior CT the abdomen and pelvis, compatible with extensive  urothelial tumor along the course of the left ureter. No right-sided hydroureteronephrosis. Urinary bladder is diffusely enlarged and heterogeneous in attenuation, corresponding to the large infiltrative bladder mass better demonstrated on recent contrast enhanced CT the abdomen and pelvis dated 11/19/2018. Urinary bladder currently measures approximately 13.3 x 10.0 x 14.0 cm, nearly all of which appears to correspond to malignant soft tissue on the prior CT examination. Stomach/Bowel: Normal appearance of the stomach. No pathologic dilatation of small bowel or colon. Normal appendix. Vascular/Lymphatic: No atherosclerotic calcifications in the abdominal aorta or pelvic vasculature. Extensive retroperitoneal lymphadenopathy better demonstrated on yesterday's contrast enhanced CT examination, with left para-aortic lymph nodes measuring up to at least 2 cm in short axis (axial image 27 of series 3). Left-sided iliac lymph nodes also noted measuring up to 1 cm in short axis. Reproductive: Prostate gland and seminal vesicles are poorly demonstrated on today's noncontrast CT examination. Other: Trace volume of ascites.  No pneumoperitoneum. Musculoskeletal: There are no aggressive appearing lytic or blastic lesions noted in the visualized portions of the skeleton. IMPRESSION: 1. Extensive urothelial neoplasm with huge bladder mass, multifocal urothelial carcinoma throughout the left ureter, and infiltrative urothelial carcinoma in the left kidney, as well as metastatic pelvic and retroperitoneal lymphadenopathy, as detailed above. These findings were better demonstrated on yesterday's contrast enhanced CT the abdomen and pelvis dated 11/19/2018. 2. Bilateral nephrostomy tubes in position. 3. Trace bilateral pleural effusions lying dependently. 4. Trace volume of ascites. Electronically Signed   By: Vinnie Langton M.D.   On: 11/20/2018 09:36   Dg Chest 2 View  Result Date: 11/04/2018 CLINICAL DATA:  Leukocytosis  EXAM: CHEST - 2 VIEW COMPARISON:  10/30/2018 FINDINGS: Normal heart size, mediastinal contours, and pulmonary vascularity. Lungs clear. No pleural effusion or pneumothorax. Bones unremarkable. IMPRESSION: Normal exam. Electronically Signed   By: Lavonia Dana M.D.   On: 11/04/2018 17:46    Micro Results     Recent Results (from the past 240 hour(s))  Urine culture     Status: None   Collection Time: 11/19/18  6:38 PM  Result Value Ref Range Status   Specimen Description URINE, CLEAN CATCH  Final   Special Requests NONE  Final   Culture   Final  NO GROWTH Performed at [facility], [place] [address], [place], [place] [address]    Report Status 11/20/2018 FINAL  Final    Today   Subjective    Nathan Knox today has no headache,no chest abdominal pain,no new weakness tingling or numbness, feels much better wants to go home today.    Objective   Blood pressure 110/61, pulse (!) 115, temperature 99.6 F (37.6 C), temperature source Oral, resp. rate 18, SpO2 100 %.   Intake/Output Summary (Last 24 hours) at 11/22/2018 0853 Last data filed at 11/22/2018 0800 [place] Knox 24 hour  Intake 472 ml  Output 600 ml  Net -128 ml    Exam  Awake Alert, Oriented X 3, No new F.N deficits, Normal affect, no bizarre ideation or hallucinations [place].AT,PERRAL Supple Neck,No JVD, No cervical lymphadenopathy appriciated.  Symmetrical Chest wall movement, Good air movement bilaterally, CTAB RRR,No Gallops, Rubs or new Murmurs, No Parasternal Heave +ve B.Sounds, Abd Soft, No tenderness, No organomegaly appriciated, No rebound - guarding or rigidity. No Cyanosis, Clubbing or edema, No new Rash or bruise, Bilat Nephrostomy tubes in place    Data Review   CBC w Diff:  Lab Results  Component Value Date   WBC 38.8 (H) 11/22/2018   HGB 8.2 (L) 11/22/2018   HCT 27.7 (L) 11/22/2018   PLT 376 11/22/2018   LYMPHOPCT 8 11/07/2018   MONOPCT 6 11/07/2018   EOSPCT 0 11/07/2018   BASOPCT 0  11/07/2018    CMP:  Lab Results  Component Value Date   NA 139 11/22/2018   K 3.7 11/22/2018   CL 110 11/22/2018   CO2 22 11/22/2018   BUN 11 11/22/2018   CREATININE 1.54 (H) 11/22/2018   PROT 7.1 11/20/2018   ALBUMIN 1.8 (L) 11/20/2018   BILITOT 0.4 11/20/2018   ALKPHOS 104 11/20/2018   AST 13 (L) 11/20/2018   ALT 16 11/20/2018  .   Total Time in preparing paper work, data evaluation and todays exam - 53 minutes  Lala Lund M.D on 11/22/2018 at 8:53 AM  [facility]   Office  [phone]

## 2018-11-22 NOTE — Care Management Important Message (Signed)
Important Message  Patient Details  Name: Marico Buckle MRN: [id number] Date of Birth: 1976-01-08   Medicare Important Message Given:  Yes  Correction patient did not signed as he has been dischargld  Orbie Pyo 11/22/2018, 2:19 PM

## 2018-11-22 NOTE — Care Management Note (Signed)
Case Management Note  Patient Details  Name: Nathan Knox MRN: [id number] Date of Birth: 11-Apr-1976  Subjective/Objective:                    Action/Plan:  Patient was not active w [facility], he declined service when they went out to see him on 12/18. After discussion of hospice services, he is agreeable to home hospice care. Referral made to [facility], spoke w Juliann Pulse. Faxed H&P and DC note to [phone]. Mother to take patient home via private car.  Expected Discharge Date:  11/22/18               Expected Discharge Plan:  Home w Hospice Care  In-House Referral:     Discharge planning Services  CM Consult  Post Acute Care Choice:    Choice offered to:     DME Arranged:    DME Agency:     HH Arranged:    HH Agency:  [facility]  Status of Service:  Completed, signed off  If discussed at [employer], dates discussed:    Additional Comments:  Carles Collet, RN 11/22/2018, 10:45 AM

## 2018-11-22 NOTE — Progress Notes (Signed)
Pt given discharge instructions with understanding. Prescriptions given to pt. IV d/c pt and his mother has no questions at this time. Mother to take pt home.

## 2018-11-22 NOTE — Care Management Important Message (Signed)
Important Message  Patient Details  Name: Jiovany Scheffel MRN: [id number] Date of Birth: 09-25-1976   Medicare Important Message Given:  Yes    Anija Brickner 11/22/2018, 2:17 PM

## 2018-11-22 NOTE — Discharge Instructions (Signed)
Disposition.  Home with hospice Condition.  Guarded CODE STATUS.  For now wants to be full code but will consider DNR soon Diet.  Regular as tolerated. Activity.  As tolerated

## 2018-12-02 DIAGNOSIS — C679 Malignant neoplasm of bladder, unspecified: Secondary | ICD-10-CM

## 2018-12-02 DIAGNOSIS — C772 Secondary and unspecified malignant neoplasm of intra-abdominal lymph nodes: Secondary | ICD-10-CM | POA: Diagnosis not present

## 2018-12-16 DIAGNOSIS — Z7901 Long term (current) use of anticoagulants: Secondary | ICD-10-CM

## 2018-12-16 DIAGNOSIS — C679 Malignant neoplasm of bladder, unspecified: Secondary | ICD-10-CM

## 2018-12-16 DIAGNOSIS — B9689 Other specified bacterial agents as the cause of diseases classified elsewhere: Secondary | ICD-10-CM

## 2018-12-16 DIAGNOSIS — C772 Secondary and unspecified malignant neoplasm of intra-abdominal lymph nodes: Secondary | ICD-10-CM

## 2018-12-16 DIAGNOSIS — Z906 Acquired absence of other parts of urinary tract: Secondary | ICD-10-CM

## 2018-12-16 DIAGNOSIS — F209 Schizophrenia, unspecified: Secondary | ICD-10-CM

## 2018-12-16 DIAGNOSIS — I82492 Acute embolism and thrombosis of other specified deep vein of left lower extremity: Secondary | ICD-10-CM

## 2018-12-16 DIAGNOSIS — Z9221 Personal history of antineoplastic chemotherapy: Secondary | ICD-10-CM

## 2018-12-16 DIAGNOSIS — N390 Urinary tract infection, site not specified: Secondary | ICD-10-CM | POA: Diagnosis not present

## 2019-04-24 DEATH — deceased

## 2020-11-23 IMAGING — DX DG CHEST 2V
2 series · 2 of 2 positions shown · non-contrast
Comparison: 10/30/2018

CLINICAL DATA: Leukocytosis

EXAM:
CHEST - 2 VIEW

[chest lat]
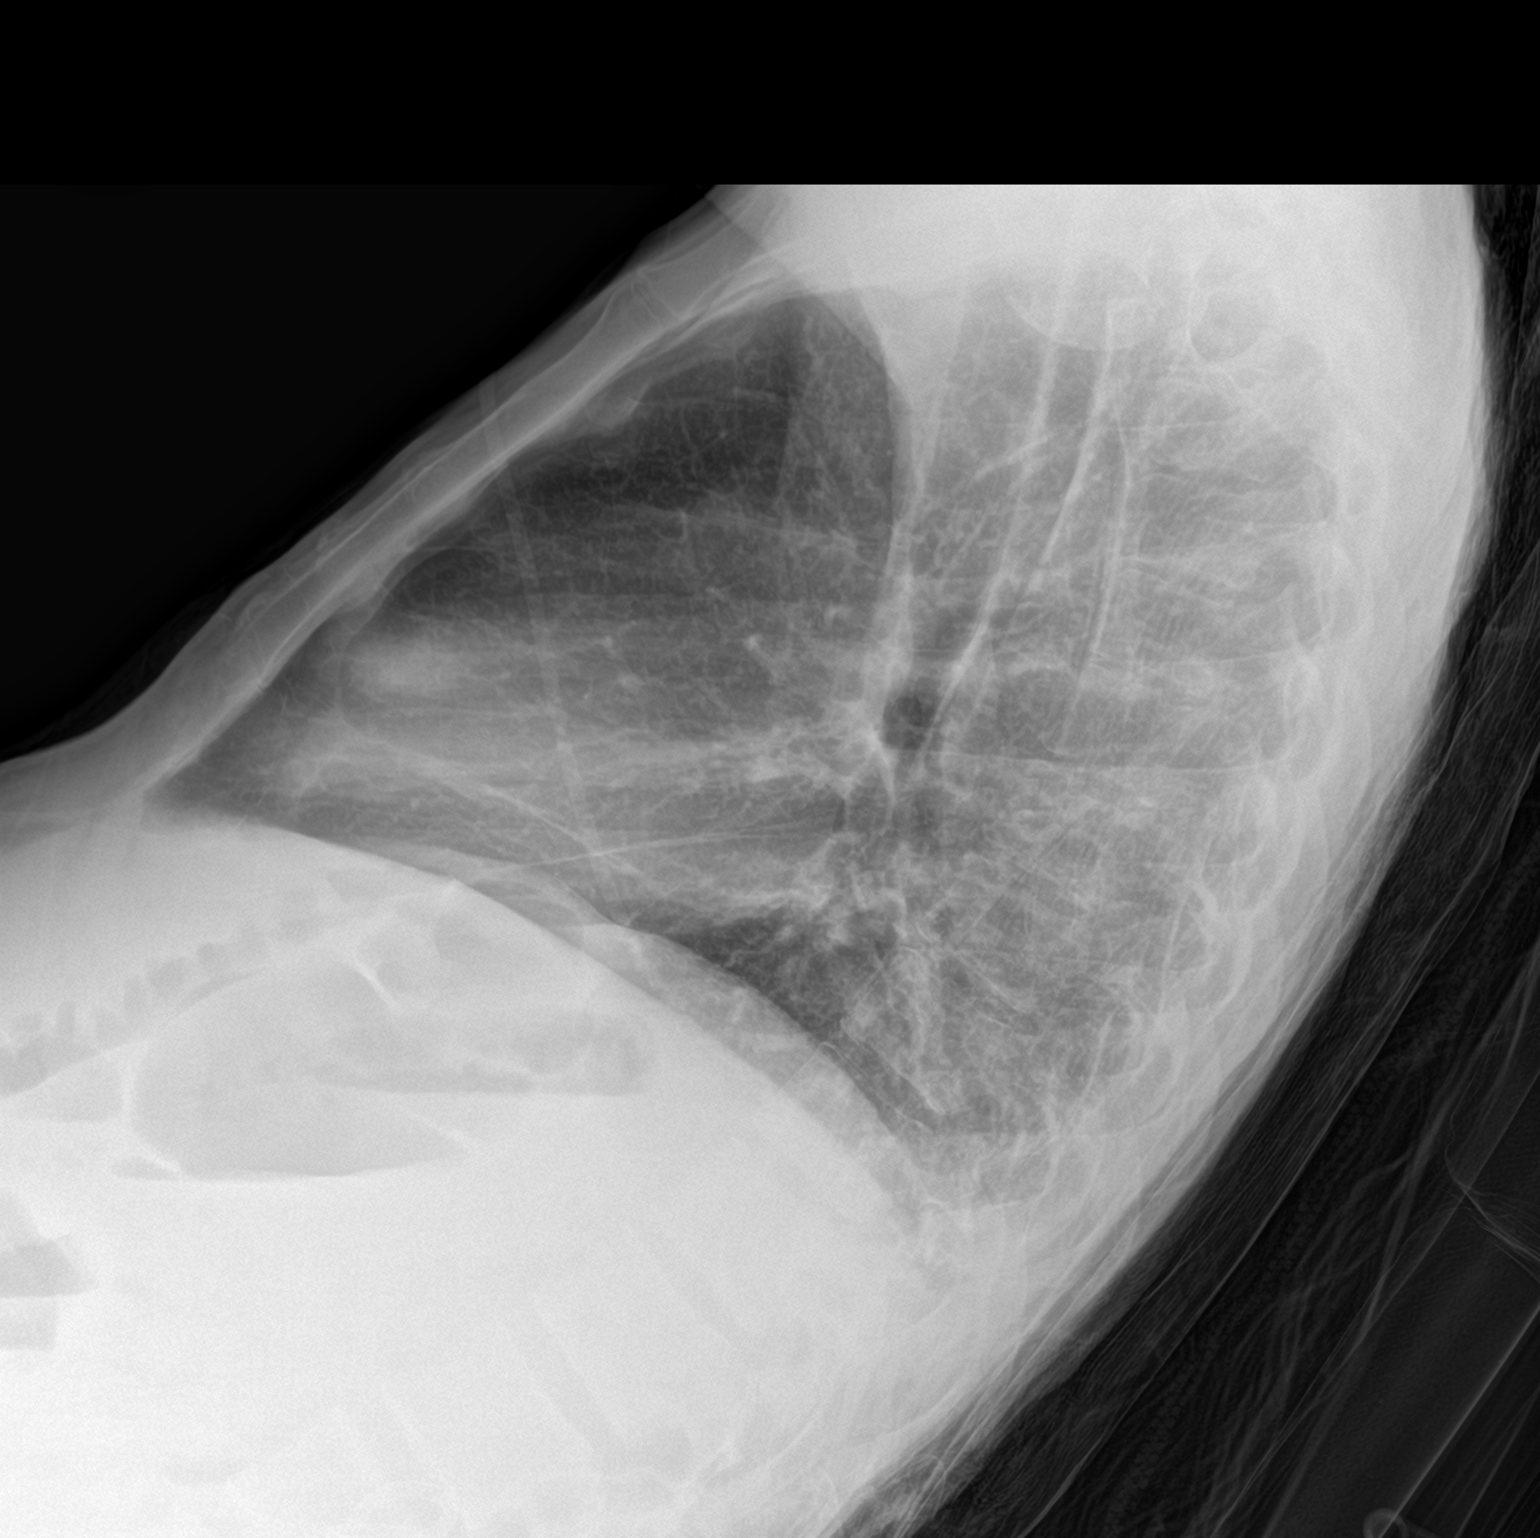

[chest ap]
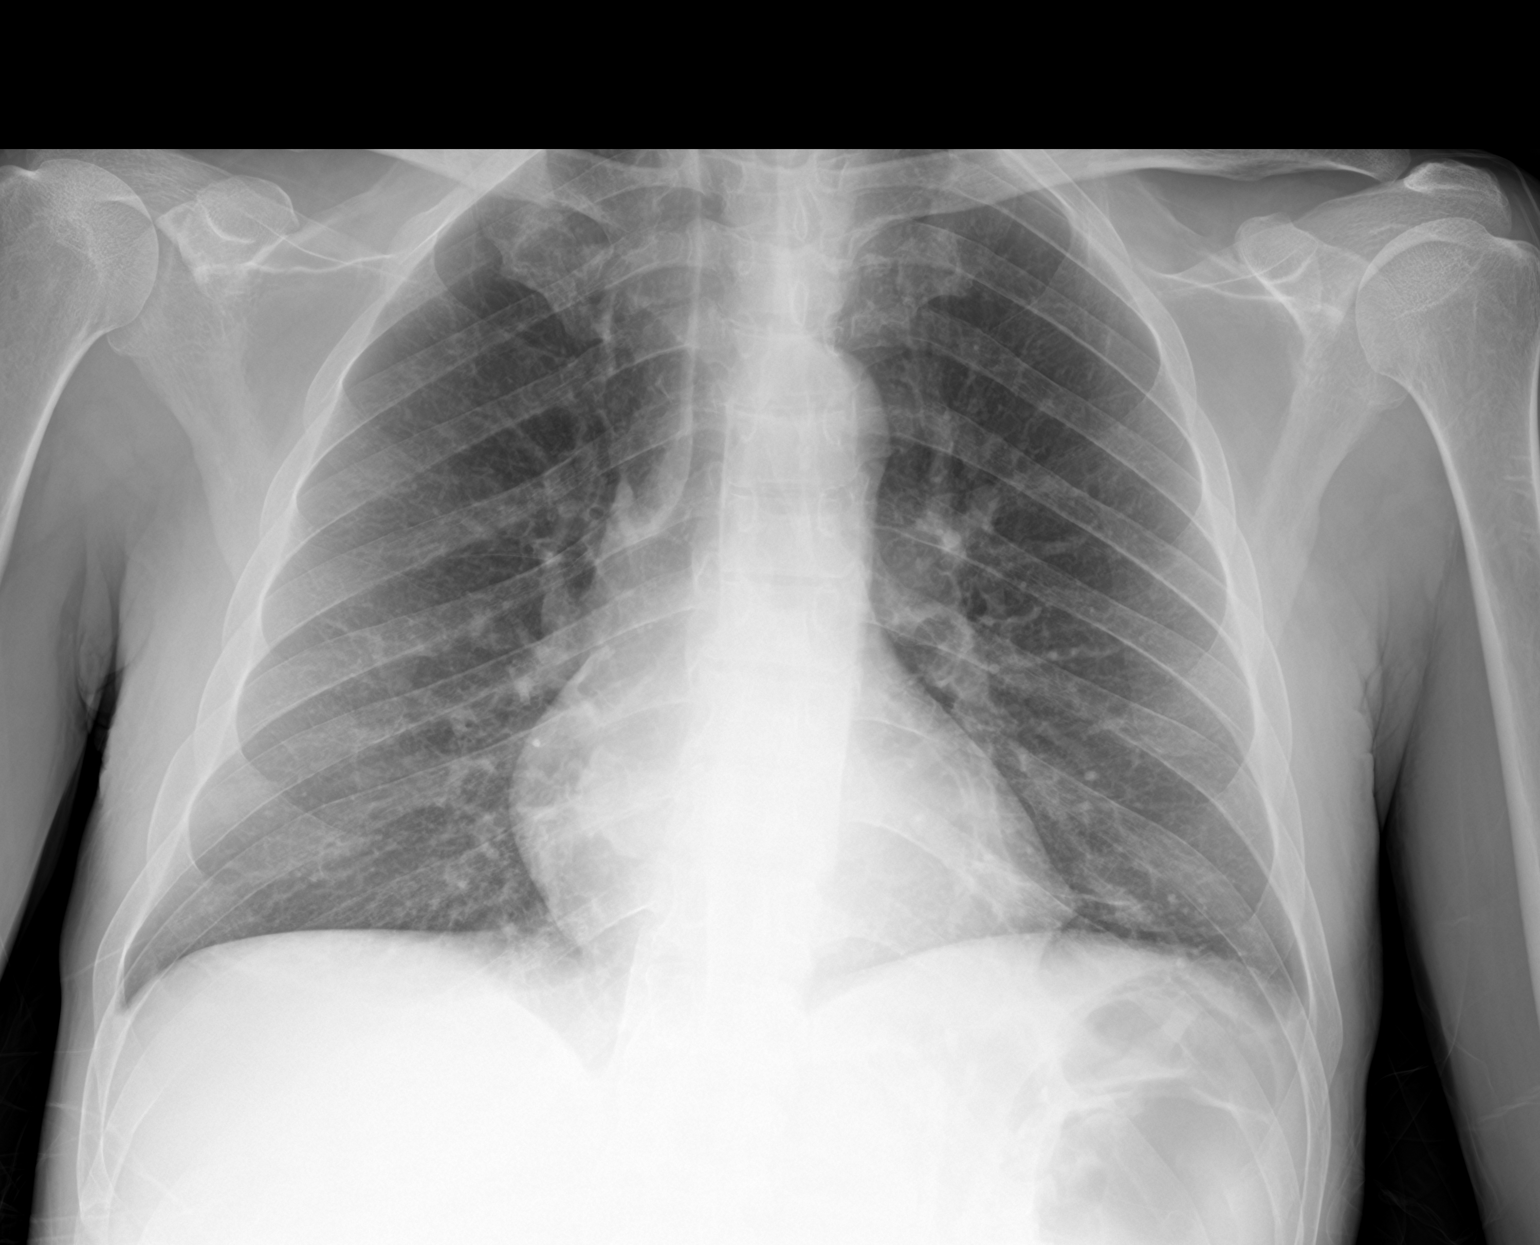

[2 of 2 positions shown; findings below may reference images not displayed]

FINDINGS: Normal heart size, mediastinal contours, and pulmonary vascularity.

Lungs clear.

No pleural effusion or pneumothorax.

Bones unremarkable.
IMPRESSION: Normal exam.

## 2020-12-09 IMAGING — CT CT ABD-PELV W/O CM
2 of 4 series · 14 of 46 positions shown, 16 images · non-contrast
Comparison: CT the abdomen and pelvis 11/19/2018.

CLINICAL DATA: 42-year-old male with history of urinary tract
infection. Suspected urinary tract calculus.

EXAM:
CT ABDOMEN AND PELVIS WITHOUT CONTRAST
TECHNIQUE: Multidetector CT imaging of the abdomen and pelvis was performed
following the standard protocol without IV contrast.

[Series 3: a/p w/o 5mm · axial · non-contrast · 0.64mm/px · z∈[-738,-328]mm · 11 of 98 slices shown, 13 images]
[im 8/98  soft-tissue]
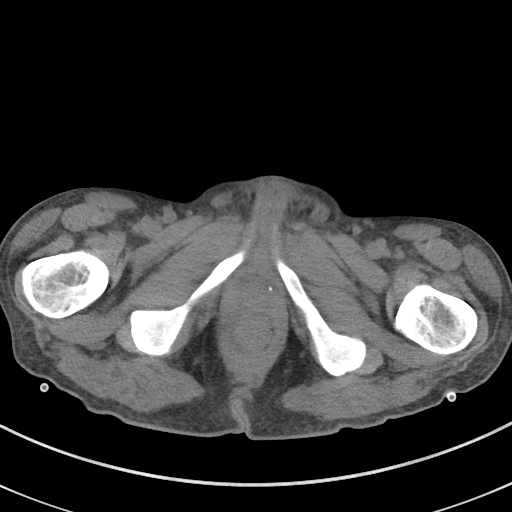
[im 8/98  bone]
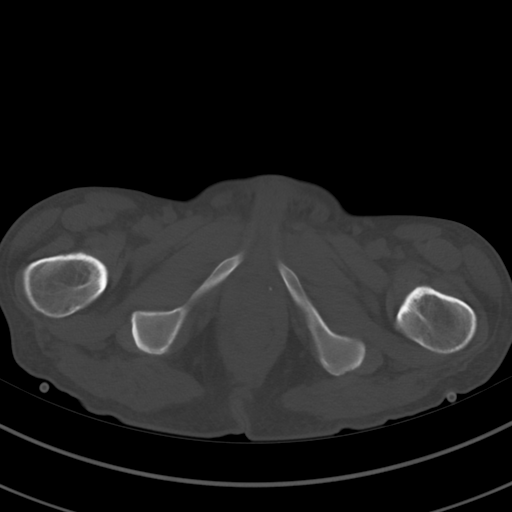
[im 15/98  soft-tissue]
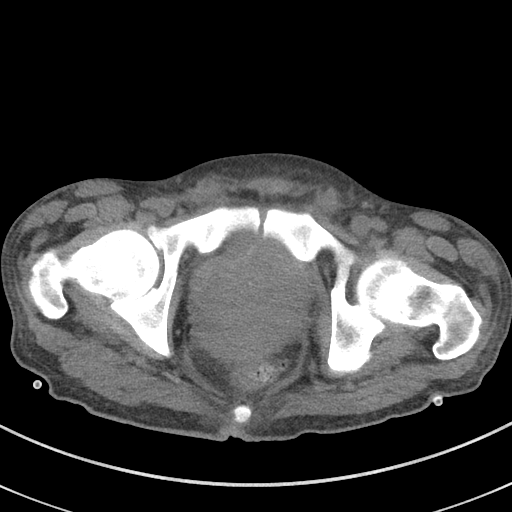
[im 23/98  soft-tissue]
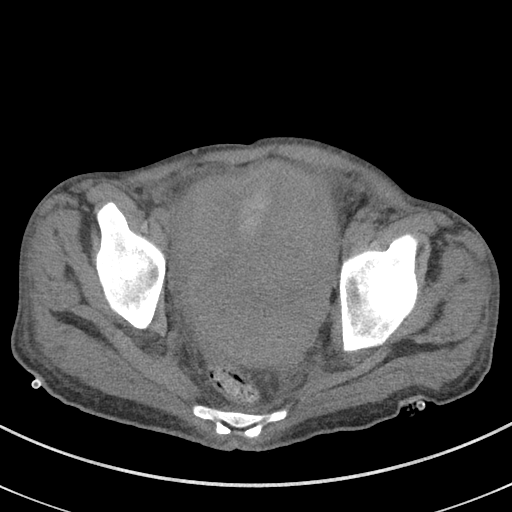
[im 30/98  soft-tissue]
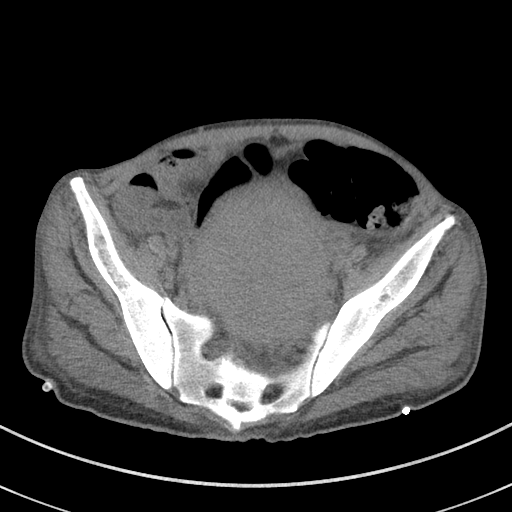
[im 42/98  soft-tissue]
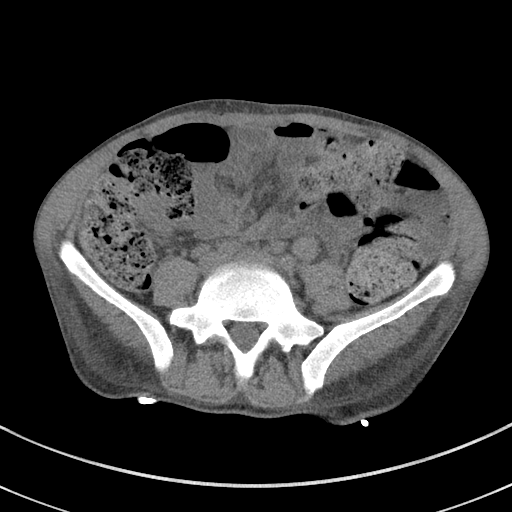
[im 49/98  soft-tissue]
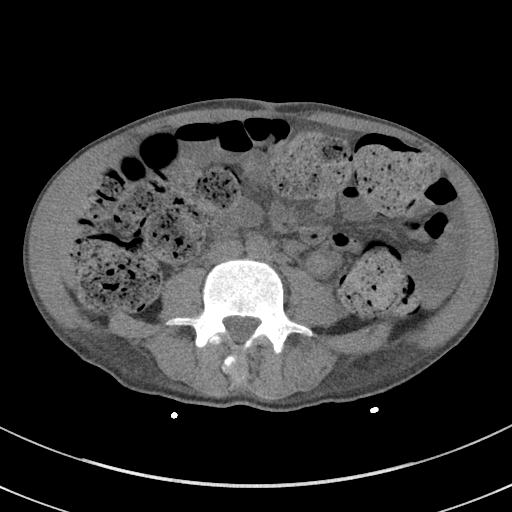
[im 56/98  soft-tissue]
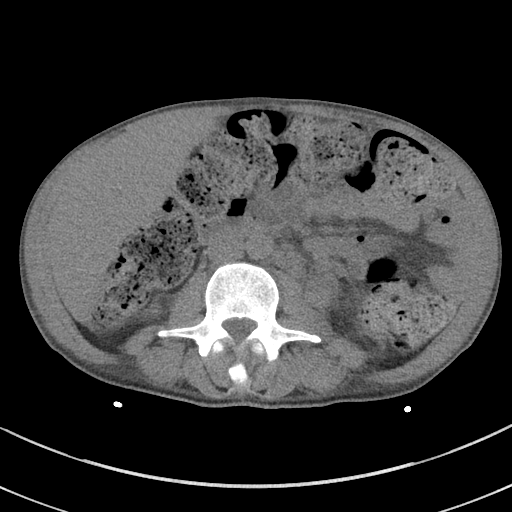
[im 68/98  soft-tissue]
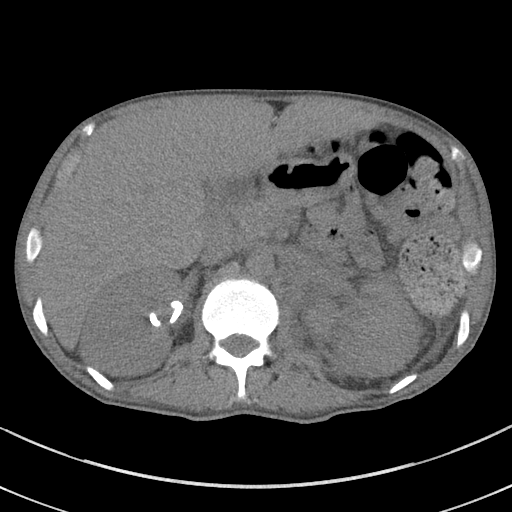
[im 75/98  soft-tissue]
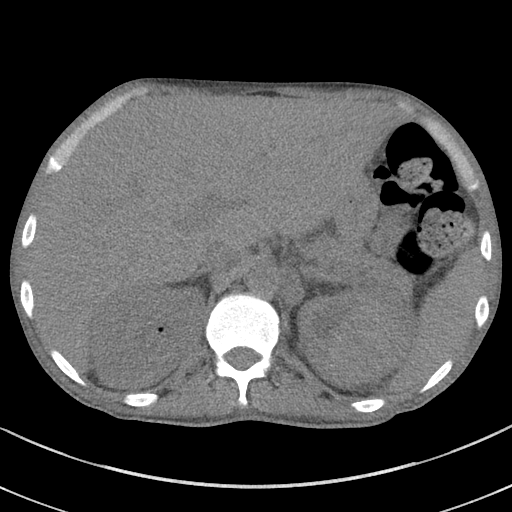
[im 75/98  bone]
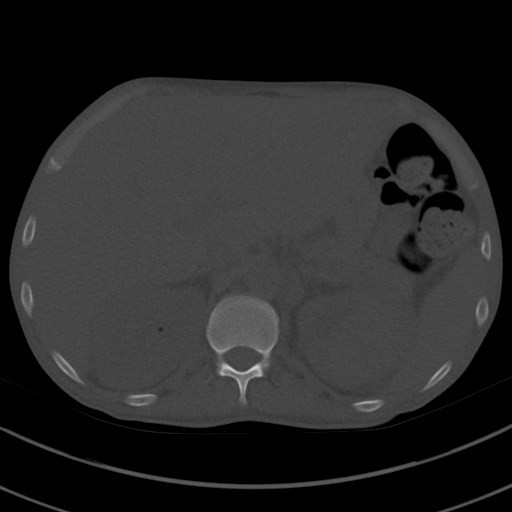
[im 83/98  soft-tissue]
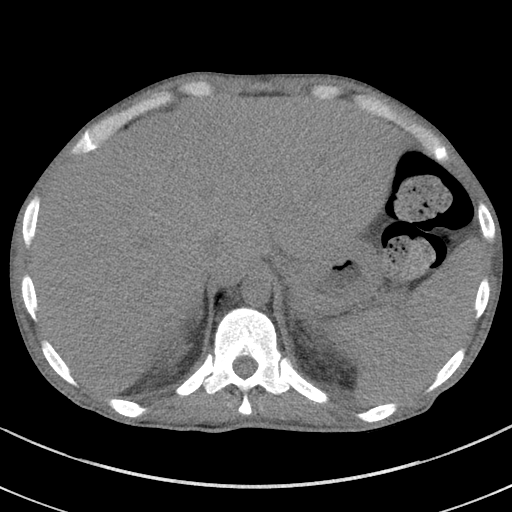
[im 90/98  soft-tissue]
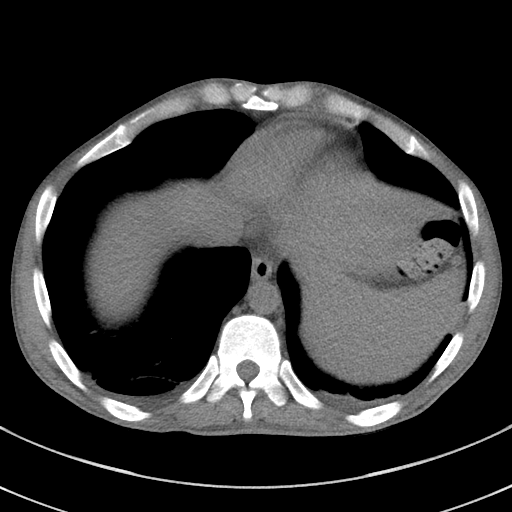

[Series 6: a/p w/o cor · coronal · non-contrast · 0.73mm/px · 3 of 150 slices shown]
[im 50/150  soft-tissue]
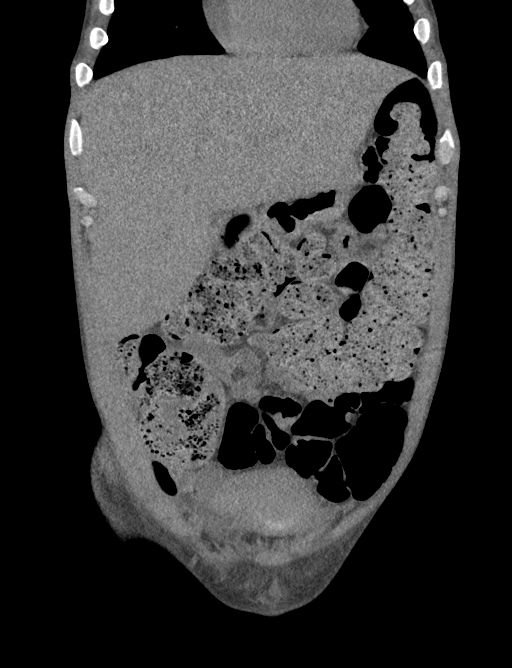
[im 67/150  soft-tissue]
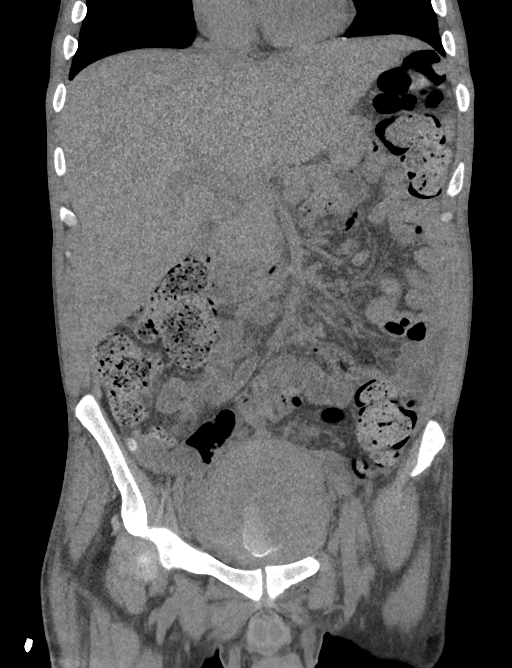
[im 83/150  soft-tissue]
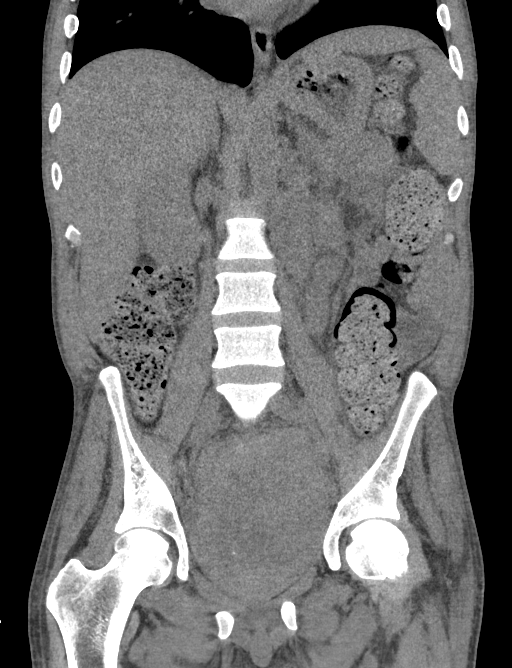

[14 of 46 positions shown; findings below may reference images not displayed]

FINDINGS: Lower chest: Trace bilateral pleural effusions lying dependently.
Subsegmental atelectasis in the right lung base.

Hepatobiliary: No definite suspicious cystic or solid hepatic
lesions are confidently identified on today's noncontrast CT
examination. Unenhanced appearance of the gallbladder is normal.

Pancreas: No definite pancreatic mass or peripancreatic fluid or
inflammatory changes are identified on today's noncontrast CT
examination.

Spleen: Unremarkable.

Adrenals/Urinary Tract: Bilateral nephrostomy tubes are again noted.
The right nephrostomy tube is reformed in the right renal pelvis.
The left nephrostomy tube projects over the inferior pole of the
left kidney. Small amount of gas in the upper pole collecting system
of the right kidney, iatrogenic. Small amount of high attenuation in
the parenchyma in the lower pole of the right kidney, favored to
reflect a small amount of intraparenchymal hemorrhage. Known
infiltrative mass in the left kidney is not well demonstrated on
today's noncontrast CT examination. Known multifocal urothelial
neoplasm in the left ureter is also not well demonstrated on today's
noncontrast CT examination, however, the left ureter is diffusely
dilated and there multiple soft tissue attenuation regions within
the left ureter which correspond to enhancing soft tissue on the
prior CT the abdomen and pelvis, compatible with extensive
urothelial tumor along the course of the left ureter. No right-sided
hydroureteronephrosis. Urinary bladder is diffusely enlarged and
heterogeneous in attenuation, corresponding to the large
infiltrative bladder mass better demonstrated on recent contrast
enhanced CT the abdomen and pelvis dated 11/19/2018. Urinary bladder
currently measures approximately 13.3 x 10.0 x 14.0 cm, nearly all
of which appears to correspond to malignant soft tissue on the prior
CT examination.

Stomach/Bowel: Normal appearance of the stomach. No pathologic
dilatation of small bowel or colon. Normal appendix.

Vascular/Lymphatic: No atherosclerotic calcifications in the
abdominal aorta or pelvic vasculature. Extensive retroperitoneal
lymphadenopathy better demonstrated on yesterday's contrast enhanced
CT examination, with left para-aortic lymph nodes measuring up to at
least 2 cm in short axis (axial image 27 of series 3). Left-sided
iliac lymph nodes also noted measuring up to 1 cm in short axis.

Reproductive: Prostate gland and seminal vesicles are poorly
demonstrated on today's noncontrast CT examination.

Other: Trace volume of ascites.  No pneumoperitoneum.

Musculoskeletal: There are no aggressive appearing lytic or blastic
lesions noted in the visualized portions of the skeleton.
IMPRESSION: 1. Extensive urothelial neoplasm with huge bladder mass, multifocal
urothelial carcinoma throughout the left ureter, and infiltrative
urothelial carcinoma in the left kidney, as well as metastatic
pelvic and retroperitoneal lymphadenopathy, as detailed above. These
findings were better demonstrated on yesterday's contrast enhanced
CT the abdomen and pelvis dated 11/19/2018.
2. Bilateral nephrostomy tubes in position.
3. Trace bilateral pleural effusions lying dependently.
4. Trace volume of ascites.
